# Patient Record
Sex: Female | Born: 1955 | Race: White | Hispanic: No | Smoking: Former smoker
Health system: Southern US, Community
[De-identification: ages and names within clinical notes are randomized; demographics above are authoritative.]

## PROBLEM LIST (undated history)

## (undated) DIAGNOSIS — Z8619 Personal history of other infectious and parasitic diseases: Secondary | ICD-10-CM

## (undated) DIAGNOSIS — F329 Major depressive disorder, single episode, unspecified: Secondary | ICD-10-CM

## (undated) DIAGNOSIS — T7840XA Allergy, unspecified, initial encounter: Secondary | ICD-10-CM

## (undated) DIAGNOSIS — F419 Anxiety disorder, unspecified: Secondary | ICD-10-CM

## (undated) DIAGNOSIS — F909 Attention-deficit hyperactivity disorder, unspecified type: Secondary | ICD-10-CM

## (undated) DIAGNOSIS — R42 Dizziness and giddiness: Secondary | ICD-10-CM

## (undated) DIAGNOSIS — M549 Dorsalgia, unspecified: Secondary | ICD-10-CM

## (undated) DIAGNOSIS — L309 Dermatitis, unspecified: Secondary | ICD-10-CM

## (undated) DIAGNOSIS — J45909 Unspecified asthma, uncomplicated: Secondary | ICD-10-CM

## (undated) DIAGNOSIS — R0600 Dyspnea, unspecified: Secondary | ICD-10-CM

## (undated) DIAGNOSIS — J449 Chronic obstructive pulmonary disease, unspecified: Secondary | ICD-10-CM

## (undated) DIAGNOSIS — M797 Fibromyalgia: Secondary | ICD-10-CM

## (undated) DIAGNOSIS — K219 Gastro-esophageal reflux disease without esophagitis: Secondary | ICD-10-CM

## (undated) DIAGNOSIS — F32A Depression, unspecified: Secondary | ICD-10-CM

## (undated) HISTORY — PX: LIVER BIOPSY: SHX301

## (undated) HISTORY — PX: TONSILLECTOMY: SUR1361

## (undated) HISTORY — DX: Depression, unspecified: F32.A

## (undated) HISTORY — PX: OVARIAN CYST REMOVAL: SHX89

## (undated) HISTORY — DX: Anxiety disorder, unspecified: F41.9

## (undated) HISTORY — DX: Dermatitis, unspecified: L30.9

## (undated) HISTORY — DX: Dizziness and giddiness: R42

## (undated) HISTORY — PX: BILATERAL CARPAL TUNNEL RELEASE: SHX6508

## (undated) HISTORY — PX: MOUTH SURGERY: SHX715

## (undated) HISTORY — PX: CYST REMOVAL HAND: SHX6279

## (undated) HISTORY — PX: TUBAL LIGATION: SHX77

## (undated) HISTORY — PX: ABDOMINAL HYSTERECTOMY: SHX81

## (undated) HISTORY — PX: KNEE SURGERY: SHX244

## (undated) HISTORY — DX: Fibromyalgia: M79.7

## (undated) HISTORY — DX: Attention-deficit hyperactivity disorder, unspecified type: F90.9

## (undated) HISTORY — DX: Allergy, unspecified, initial encounter: T78.40XA

## (undated) HISTORY — PX: ELBOW SURGERY: SHX618

## (undated) HISTORY — DX: Dorsalgia, unspecified: M54.9

## (undated) HISTORY — DX: Dyspnea, unspecified: R06.00

## (undated) HISTORY — DX: Chronic obstructive pulmonary disease, unspecified: J44.9

## (undated) HISTORY — DX: Personal history of other infectious and parasitic diseases: Z86.19

## (undated) HISTORY — PX: NOSE SURGERY: SHX723

## (undated) HISTORY — DX: Major depressive disorder, single episode, unspecified: F32.9

---

## 2015-12-10 DIAGNOSIS — F331 Major depressive disorder, recurrent, moderate: Secondary | ICD-10-CM | POA: Diagnosis not present

## 2015-12-21 DIAGNOSIS — F331 Major depressive disorder, recurrent, moderate: Secondary | ICD-10-CM | POA: Diagnosis not present

## 2015-12-28 DIAGNOSIS — N390 Urinary tract infection, site not specified: Secondary | ICD-10-CM | POA: Diagnosis not present

## 2015-12-28 DIAGNOSIS — M545 Low back pain: Secondary | ICD-10-CM | POA: Diagnosis not present

## 2015-12-30 DIAGNOSIS — K219 Gastro-esophageal reflux disease without esophagitis: Secondary | ICD-10-CM | POA: Diagnosis not present

## 2016-01-27 DIAGNOSIS — F331 Major depressive disorder, recurrent, moderate: Secondary | ICD-10-CM | POA: Diagnosis not present

## 2016-02-29 DIAGNOSIS — F331 Major depressive disorder, recurrent, moderate: Secondary | ICD-10-CM | POA: Diagnosis not present

## 2016-03-29 DIAGNOSIS — F331 Major depressive disorder, recurrent, moderate: Secondary | ICD-10-CM | POA: Diagnosis not present

## 2016-04-17 DIAGNOSIS — R200 Anesthesia of skin: Secondary | ICD-10-CM | POA: Diagnosis not present

## 2016-04-26 DIAGNOSIS — F331 Major depressive disorder, recurrent, moderate: Secondary | ICD-10-CM | POA: Diagnosis not present

## 2016-04-27 DIAGNOSIS — M779 Enthesopathy, unspecified: Secondary | ICD-10-CM | POA: Diagnosis not present

## 2016-05-10 DIAGNOSIS — F331 Major depressive disorder, recurrent, moderate: Secondary | ICD-10-CM | POA: Diagnosis not present

## 2016-05-23 DIAGNOSIS — F331 Major depressive disorder, recurrent, moderate: Secondary | ICD-10-CM | POA: Diagnosis not present

## 2016-05-29 DIAGNOSIS — M791 Myalgia: Secondary | ICD-10-CM | POA: Diagnosis not present

## 2016-05-29 DIAGNOSIS — M545 Low back pain: Secondary | ICD-10-CM | POA: Diagnosis not present

## 2016-06-22 DIAGNOSIS — F331 Major depressive disorder, recurrent, moderate: Secondary | ICD-10-CM | POA: Diagnosis not present

## 2016-06-28 DIAGNOSIS — H6123 Impacted cerumen, bilateral: Secondary | ICD-10-CM | POA: Diagnosis not present

## 2016-06-28 DIAGNOSIS — R42 Dizziness and giddiness: Secondary | ICD-10-CM | POA: Diagnosis not present

## 2016-07-19 DIAGNOSIS — F331 Major depressive disorder, recurrent, moderate: Secondary | ICD-10-CM | POA: Diagnosis not present

## 2016-08-01 DIAGNOSIS — L03114 Cellulitis of left upper limb: Secondary | ICD-10-CM | POA: Diagnosis not present

## 2016-08-01 DIAGNOSIS — L309 Dermatitis, unspecified: Secondary | ICD-10-CM | POA: Diagnosis not present

## 2016-08-01 DIAGNOSIS — M797 Fibromyalgia: Secondary | ICD-10-CM | POA: Diagnosis not present

## 2016-08-01 DIAGNOSIS — H6011 Cellulitis of right external ear: Secondary | ICD-10-CM | POA: Diagnosis not present

## 2016-08-01 DIAGNOSIS — R42 Dizziness and giddiness: Secondary | ICD-10-CM | POA: Diagnosis not present

## 2016-08-01 DIAGNOSIS — M545 Low back pain: Secondary | ICD-10-CM | POA: Diagnosis not present

## 2016-08-01 DIAGNOSIS — F418 Other specified anxiety disorders: Secondary | ICD-10-CM | POA: Diagnosis not present

## 2016-08-01 DIAGNOSIS — L03113 Cellulitis of right upper limb: Secondary | ICD-10-CM | POA: Diagnosis not present

## 2016-08-03 DIAGNOSIS — L237 Allergic contact dermatitis due to plants, except food: Secondary | ICD-10-CM | POA: Diagnosis not present

## 2016-08-03 DIAGNOSIS — H6011 Cellulitis of right external ear: Secondary | ICD-10-CM | POA: Diagnosis not present

## 2016-08-03 DIAGNOSIS — L03114 Cellulitis of left upper limb: Secondary | ICD-10-CM | POA: Diagnosis not present

## 2016-08-16 DIAGNOSIS — F331 Major depressive disorder, recurrent, moderate: Secondary | ICD-10-CM | POA: Diagnosis not present

## 2016-08-30 DIAGNOSIS — Z720 Tobacco use: Secondary | ICD-10-CM | POA: Diagnosis not present

## 2016-08-30 DIAGNOSIS — R002 Palpitations: Secondary | ICD-10-CM | POA: Diagnosis not present

## 2016-08-30 DIAGNOSIS — E785 Hyperlipidemia, unspecified: Secondary | ICD-10-CM | POA: Diagnosis not present

## 2016-09-07 DIAGNOSIS — Z23 Encounter for immunization: Secondary | ICD-10-CM | POA: Diagnosis not present

## 2016-09-13 DIAGNOSIS — F331 Major depressive disorder, recurrent, moderate: Secondary | ICD-10-CM | POA: Diagnosis not present

## 2016-09-18 DIAGNOSIS — F331 Major depressive disorder, recurrent, moderate: Secondary | ICD-10-CM | POA: Diagnosis not present

## 2017-08-30 DIAGNOSIS — Z23 Encounter for immunization: Secondary | ICD-10-CM | POA: Diagnosis not present

## 2017-12-15 ENCOUNTER — Ambulatory Visit: Payer: Medicare Other

## 2017-12-15 ENCOUNTER — Other Ambulatory Visit: Payer: Self-pay

## 2017-12-15 ENCOUNTER — Encounter: Payer: Self-pay | Admitting: Gynecology

## 2017-12-15 ENCOUNTER — Ambulatory Visit
Admission: EM | Admit: 2017-12-15 | Discharge: 2017-12-15 | Disposition: A | Discharge status: Home / Self Care | Payer: Medicare Other | Attending: Emergency Medicine | Admitting: Emergency Medicine

## 2017-12-15 DIAGNOSIS — J45909 Unspecified asthma, uncomplicated: Secondary | ICD-10-CM | POA: Diagnosis not present

## 2017-12-15 DIAGNOSIS — Z87891 Personal history of nicotine dependence: Secondary | ICD-10-CM | POA: Diagnosis not present

## 2017-12-15 DIAGNOSIS — K219 Gastro-esophageal reflux disease without esophagitis: Secondary | ICD-10-CM | POA: Diagnosis not present

## 2017-12-15 DIAGNOSIS — R05 Cough: Secondary | ICD-10-CM | POA: Diagnosis not present

## 2017-12-15 DIAGNOSIS — B1920 Unspecified viral hepatitis C without hepatic coma: Secondary | ICD-10-CM | POA: Diagnosis not present

## 2017-12-15 DIAGNOSIS — R059 Cough, unspecified: Secondary | ICD-10-CM

## 2017-12-15 HISTORY — DX: Unspecified asthma, uncomplicated: J45.909

## 2017-12-15 HISTORY — DX: Gastro-esophageal reflux disease without esophagitis: K21.9

## 2017-12-15 MED ORDER — DEXLANSOPRAZOLE 30 MG PO CPDR: 30.0000 mg | DELAYED_RELEASE_CAPSULE | Freq: Every day | ORAL | 0 refills | Status: DC

## 2017-12-15 NOTE — ED Provider Notes (Signed)
[facility]    CSN: [id number] Arrival date & time: 12/15/17  1120     History   Chief Complaint Chief Complaint  Patient presents with  . 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   HPI  62 year old female former smoker presents with a dry cough that she has had for 6 months.  It is worse at nighttime. Stopped Smoking 3 years ago but was vaping up to 1 year ago and finally stopped all inhalation.  Has had weight loss recently but she attributes that to a new puppy and the exercise that ensues.  Has a history of GERD and was on Dexilant but has not found a local primary care to re-prescribed medication.  She noticed that when she came off the Dexilant that the cough was somewhat worse.      Past Medical History:  Diagnosis Date  . Asthma   . GERD (gastroesophageal reflux disease)   . Hepatitis C     There are no active problems to display for this patient.   Past Surgical History:  Procedure Laterality Date  . ABDOMINAL HYSTERECTOMY    . BILATERAL CARPAL TUNNEL RELEASE    . CYST REMOVAL HAND    . ELBOW SURGERY    . KNEE SURGERY    . LIVER BIOPSY    . MOUTH SURGERY    . OVARIAN CYST REMOVAL    . TONSILLECTOMY    . TUBAL LIGATION      OB History    No data available       Home Medications    Prior to Admission medications   Medication Sig Start Date End Date Taking? Authorizing Provider  cetirizine (ZYRTEC) 10 MG tablet Take 10 mg by mouth daily.   Yes [provider]  Dexlansoprazole 30 MG capsule Take 1 capsule (30 mg total) by mouth daily. 12/15/17   Lutricia Feiloemer, Tison Leibold P, PA-C    Family History Family History  Problem Relation Age of Onset  . Heart failure Mother   . Emphysema Mother     Social History Social History   Tobacco Use  . Smoking status: Former Smoker    Last attempt to quit: 09/14/2014    Years since quitting: 3.2  . Smokeless tobacco: Never Used  Substance Use Topics  . Alcohol use: Not on file  .  Drug use: No     Allergies   Amitriptyline   Review of Systems Review of Systems  Constitutional: Negative for activity change, appetite change, chills, fatigue and fever.  Respiratory: Positive for cough. Negative for shortness of breath, wheezing and stridor.   All other systems reviewed and are negative.    Physical Exam Triage Vital Signs ED Triage Vitals  Enc Vitals Group     BP 12/15/17 1155 132/82     Pulse Rate 12/15/17 1155 87     Resp 12/15/17 1155 16     Temp 12/15/17 1155 98.7 F (37.1 C)     Temp Source 12/15/17 1155 Oral     SpO2 12/15/17 1155 98 %     Weight 12/15/17 1147 117 lb (53.1 kg)     Height 12/15/17 1147 5\' 1"  (1.549 m)     Head Circumference --      Peak Flow --      Pain Score 12/15/17 1147 2     Pain Loc --      Pain Edu? --      Excl. in GC? --  No data found.  Updated Vital Signs BP 132/82 (BP Location: Left Arm)   Pulse 87   Temp 98.7 F (37.1 C) (Oral)   Resp 16   Ht 5\' 1"  (1.549 m)   Wt 117 lb (53.1 kg)   SpO2 98%   BMI 22.11 kg/m   Visual Acuity Right Eye Distance:   Left Eye Distance:   Bilateral Distance:    Right Eye Near:   Left Eye Near:    Bilateral Near:     Physical Exam  Constitutional: She is oriented to person, place, and time. She appears well-developed and well-nourished. No distress.  HENT:  Head: Normocephalic.  Right Ear: External ear normal.  Left Ear: External ear normal.  Nose: Nose normal.  Mouth/Throat: Oropharynx is clear and moist. No oropharyngeal exudate.  Eyes: Pupils are equal, round, and reactive to light. Right eye exhibits no discharge. Left eye exhibits no discharge.  Neck: Normal range of motion.  Pulmonary/Chest: Effort normal and breath sounds normal.  Musculoskeletal: Normal range of motion.  Lymphadenopathy:    She has no cervical adenopathy.  Neurological: She is alert and oriented to person, place, and time.  Skin: Skin is warm and dry. She is not diaphoretic.    Psychiatric: She has a normal mood and affect. Her behavior is normal. Judgment and thought content normal.  Nursing note and vitals reviewed.    UC Treatments / Results  Labs (all labs ordered are listed, but only abnormal results are displayed) Labs Reviewed - No data to display  EKG  EKG Interpretation None       Radiology Dg Chest 2 View  Result Date: 12/15/2017 CLINICAL DATA:  Dry nonproductive cough. EXAM: CHEST  2 VIEW COMPARISON:  None. FINDINGS: The lungs are clear without focal pneumonia, edema, pneumothorax or pleural effusion. Tiny granuloma noted right upper lobe. The cardiopericardial silhouette is within normal limits for size. The visualized bony structures of the thorax are intact. IMPRESSION: No active cardiopulmonary disease. Electronically Signed   By: Kennith Center M.D.   On: 12/15/2017 13:01    Procedures Procedures (including critical care time)  Medications Ordered in UC Medications - No data to display   Initial Impression / Assessment and Plan / UC Course  I have reviewed the triage vital signs and the nursing notes.  Pertinent labs & imaging results that were available during my care of the patient were reviewed by me and considered in my medical decision making (see chart for details).     Plan: 1. Test/x-ray results and diagnosis reviewed with patient 2. rx as per orders; risks, benefits, potential side effects reviewed with patient 3. Recommend supportive treatment with use of Dexilant for GERD.  Is recommended that because of the chronic cough that she has had for 6 months, her past history of smoking as well as the granuloma found on x-ray today she should follow-up with a pulmonologist in the very near future.  So I given her the name of a primary care physician that she may contact to establish with. 4. F/u prn if symptoms worsen or don't improve   Final Clinical Impressions(s) / UC Diagnoses   Final diagnoses:  Cough   Gastroesophageal reflux disease, esophagitis presence not specified    ED Discharge Orders        Ordered    Dexlansoprazole 30 MG capsule  Daily     12/15/17 1354       Controlled Substance Prescriptions [place] Controlled Substance Registry consulted? Not  Applicable   Lutricia Feil, PA-C 12/15/17 1359

## 2017-12-15 NOTE — Discharge Instructions (Signed)
Recommend following up with pulmonologist because of the granuloma in the right upper lobe as well as the coughing that she had for over 6 months.

## 2017-12-15 NOTE — ED Triage Notes (Signed)
Patient c/o dry cough x 6 months/

## 2017-12-18 ENCOUNTER — Telehealth: Payer: Self-pay | Admitting: Emergency Medicine

## 2017-12-18 NOTE — Telephone Encounter (Signed)
Called to follow up after patient's recent visit. Patient states she is starting to feel better, but still coughing. Patient states she has appointment with Pulmonologist tomorrow.

## 2017-12-20 ENCOUNTER — Encounter: Payer: Self-pay | Admitting: Internal Medicine

## 2017-12-20 ENCOUNTER — Ambulatory Visit (INDEPENDENT_AMBULATORY_CARE_PROVIDER_SITE_OTHER): Payer: Medicare Other | Admitting: Internal Medicine

## 2017-12-20 DIAGNOSIS — R918 Other nonspecific abnormal finding of lung field: Secondary | ICD-10-CM

## 2017-12-20 MED ORDER — ALBUTEROL SULFATE HFA 108 (90 BASE) MCG/ACT IN AERS: 2.0000 | INHALATION_SPRAY | Freq: Four times a day (QID) | RESPIRATORY_TRACT | 2 refills | Status: DC | PRN

## 2017-12-20 NOTE — Patient Instructions (Signed)
CT chest needed PFT's ordered  Follow up after tests completed

## 2017-12-20 NOTE — Progress Notes (Signed)
Name: Sarah Mcdonald MRN: [id number] DOB: [birth year]     CONSULTATION DATE: 2.21.19 REFERRING MD :    CHIEF COMPLAINT:  abnormal CXR STUDIES:  CXR 11/2017 I have Independently reviewed images of  CXR   on 12/20/2017 Interpretation: I do NOT see any obvious abnormalities No masses or pneumonia     HISTORY OF PRESENT ILLNESS:   62 year old pleasant white female seen today for an abnormal report of a tiny granuloma in the right chest based on a chest x-ray that was performed [facility] Patient has been diagnosed with childhood asthma and as she grew older her asthma symptoms returned however she has been smoking 1 pack a day for the past 40 years She quit November 2015  Patient visited the urgent care approximately 2 weeks ago with acute cough chest congestion and right shoulder pain Patient was diagnosed with really bad esophageal reflux and was given proton pump inhibitor Her chest x-ray showed an abnormal finding of a tiny granuloma in the right upper lobe  At this time patient does not have any acute respiratory symptoms at this time Patient patient has occasional shortness of breath and dyspnea on exertion Patient does not have any acute cough or wheezing at this time Patient uses albuterol as needed very infrequently and thus the only inhaler she has at this time  Patient has no signs of infection at this time No signs of acute exacerbation at this time No signs of acute CHF at this time  PAST MEDICAL HISTORY :   has a past medical history of Asthma, GERD (gastroesophageal reflux disease), and Hepatitis C.  has a past surgical history that includes Liver biopsy; Bilateral carpal tunnel release; Cyst removal hand; Elbow surgery; Knee surgery; Mouth surgery; Tonsillectomy; Abdominal hysterectomy; Tubal ligation; and Ovarian cyst removal. Prior to Admission medications   Medication Sig Start Date End Date Taking? Authorizing Provider  cetirizine (ZYRTEC) 10 MG  tablet Take 10 mg by mouth daily.    [provider]  Dexlansoprazole 30 MG capsule Take 1 capsule (30 mg total) by mouth daily. 12/15/17   Lutricia Feil, PA-C   Allergies  Allergen Reactions  . Amitriptyline     FAMILY HISTORY:  family history includes Emphysema in her mother; Heart failure in her mother. SOCIAL HISTORY:  reports that she quit smoking about 3 years ago. she has never used smokeless tobacco. She reports that she does not use drugs.  REVIEW OF SYSTEMS:   Constitutional: Negative for fever, chills, weight loss, malaise/fatigue and diaphoresis.  HENT: Negative for hearing loss, ear pain, nosebleeds, congestion, sore throat, neck pain, tinnitus and ear discharge.   Eyes: Negative for blurred vision, double vision, photophobia, pain, discharge and redness.  Respiratory: Negative for cough, hemoptysis, sputum production, +shortness of breath, + wheezing and stridor.   Cardiovascular: Negative for chest pain, palpitations, orthopnea, claudication, leg swelling and PND.  Gastrointestinal: Negative for heartburn, nausea, vomiting, abdominal pain, diarrhea, constipation, blood in stool and melena.  Genitourinary: Negative for dysuria, urgency, frequency, hematuria and flank pain.  Musculoskeletal: Negative for myalgias, back pain, joint pain and falls.  Skin: Negative for itching and rash.  Neurological: Negative for dizziness, tingling, tremors, sensory change, speech change, focal weakness, seizures, loss of consciousness, weakness and headaches.  Endo/Heme/Allergies: Negative for environmental allergies and polydipsia. Does not bruise/bleed easily.  ALL OTHER ROS ARE NEGATIVE  BP 128/80 (BP Location: Left Arm, Cuff Size: Normal)   Pulse 77   Resp 16  Ht 5\' 1"  (1.549 m)   Wt 125 lb (56.7 kg)   SpO2 97%   BMI 23.62 kg/m   Physical Examination:   GENERAL:NAD, no fevers, chills, no weakness no fatigue HEAD: Normocephalic, atraumatic.  EYES: Pupils equal,  round, reactive to light. Extraocular muscles intact. No scleral icterus.  MOUTH: Moist mucosal membrane.   EAR, NOSE, THROAT: Clear without exudates. No external lesions.  NECK: Supple. No thyromegaly. No nodules. No JVD.  PULMONARY:CTA B/L no wheezes, no crackles, no rhonchi CARDIOVASCULAR: S1 and S2. Regular rate and rhythm. No murmurs, rubs, or gallops. No edema.  GASTROINTESTINAL: Soft, nontender, nondistended. No masses. Positive bowel sounds.  MUSCULOSKELETAL: No swelling, clubbing, or edema. Range of motion full in all extremities.  NEUROLOGIC: Cranial nerves II through XII are intact. No gross focal neurological deficits.  SKIN: No ulceration, lesions, rashes, or cyanosis. Skin warm and dry. Turgor intact.  PSYCHIATRIC: Mood, affect within normal limits. The patient is awake, alert and oriented x 3. Insight, judgment intact.      ASSESSMENT / PLAN: 62 year old pleasant white female with a past medical history of asthma as a child with extensive smoking history of 1 pack a day for the past 40 years in the setting of intermittent shortness of breath and dyspnea on exertion with intermittent wheezing most likely is related to underlying COPD with an abnormal chest x-ray that shows a tiny granuloma in the right upper lobe however a CT chest is needed for further evaluation  At this time her presumed COPD seems to be stable and controlled with albuterol alone Patient will need pulmonary function testing to assess her lung function Patient will need CT of his chest to fully evaluate her right upper lobe granuloma  Patient satisfied with Plan of action and management. All questions answered Patient to follow up after tests completed  Lucie LeatherKurian David Tyrek Lawhorn, M.D.  [facility]  Medical Director [facility] Medical Director [facility]

## 2018-01-01 ENCOUNTER — Ambulatory Visit: Payer: Medicare Other | Attending: Internal Medicine

## 2018-01-01 ENCOUNTER — Ambulatory Visit
Admission: RE | Admit: 2018-01-01 | Discharge: 2018-01-01 | Disposition: A | Discharge status: Home / Self Care | Payer: Medicare Other | Source: Ambulatory Visit | Attending: Internal Medicine | Admitting: Internal Medicine

## 2018-01-01 DIAGNOSIS — R911 Solitary pulmonary nodule: Secondary | ICD-10-CM | POA: Diagnosis not present

## 2018-01-01 DIAGNOSIS — R918 Other nonspecific abnormal finding of lung field: Secondary | ICD-10-CM

## 2018-01-01 MED ORDER — ALBUTEROL SULFATE (2.5 MG/3ML) 0.083% IN NEBU
2.5000 mg | INHALATION_SOLUTION | Freq: Once | RESPIRATORY_TRACT | Status: AC
  Administered 2018-01-01: 2.5 mg via RESPIRATORY_TRACT
  Filled 2018-01-01: qty 3

## 2018-01-08 ENCOUNTER — Ambulatory Visit (INDEPENDENT_AMBULATORY_CARE_PROVIDER_SITE_OTHER): Payer: Medicare Other | Admitting: Family Medicine

## 2018-01-08 ENCOUNTER — Encounter: Payer: Self-pay | Admitting: Family Medicine

## 2018-01-08 ENCOUNTER — Telehealth: Payer: Self-pay

## 2018-01-08 VITALS — BP 120/70 | HR 64 | Ht 61.0 in | Wt 126.0 lb

## 2018-01-08 DIAGNOSIS — K219 Gastro-esophageal reflux disease without esophagitis: Secondary | ICD-10-CM

## 2018-01-08 DIAGNOSIS — M545 Low back pain, unspecified: Secondary | ICD-10-CM

## 2018-01-08 DIAGNOSIS — Z7689 Persons encountering health services in other specified circumstances: Secondary | ICD-10-CM

## 2018-01-08 DIAGNOSIS — F329 Major depressive disorder, single episode, unspecified: Secondary | ICD-10-CM

## 2018-01-08 DIAGNOSIS — G8929 Other chronic pain: Secondary | ICD-10-CM | POA: Diagnosis not present

## 2018-01-08 DIAGNOSIS — J449 Chronic obstructive pulmonary disease, unspecified: Secondary | ICD-10-CM

## 2018-01-08 DIAGNOSIS — S20419A Abrasion of unspecified back wall of thorax, initial encounter: Secondary | ICD-10-CM | POA: Insufficient documentation

## 2018-01-08 DIAGNOSIS — F32A Depression, unspecified: Secondary | ICD-10-CM

## 2018-01-08 MED ORDER — DEXLANSOPRAZOLE 60 MG PO CPDR: 60.0000 mg | DELAYED_RELEASE_CAPSULE | Freq: Every day | ORAL | 1 refills | Status: DC

## 2018-01-08 MED ORDER — ALBUTEROL SULFATE HFA 108 (90 BASE) MCG/ACT IN AERS: 2.0000 | INHALATION_SPRAY | Freq: Four times a day (QID) | RESPIRATORY_TRACT | 2 refills | Status: DC | PRN

## 2018-01-08 MED ORDER — CYCLOBENZAPRINE HCL 10 MG PO TABS: 10.0000 mg | ORAL_TABLET | Freq: Three times a day (TID) | ORAL | 1 refills | Status: DC | PRN

## 2018-01-08 MED ORDER — CETIRIZINE HCL 10 MG PO TABS: 10.0000 mg | ORAL_TABLET | Freq: Every day | ORAL | 1 refills | Status: DC

## 2018-01-08 NOTE — Progress Notes (Addendum)
Name: Sarah DeutscherLoren Gurka   MRN: [id number]    DOB: 07/10/1956   Date:01/08/2018       Progress Note  Subjective  Chief Complaint  Chief Complaint  Patient presents with  . Establish Care    [place]oved from [place]  . Gastroesophageal Reflux    need to go back to GI doctor d/t Dexilant not working anymore. has "dry cough from GERD"    Patient present for establishment of care.   Gastroesophageal Reflux  She complains of abdominal pain, belching, chest pain, choking, coughing, dysphagia, heartburn and nausea. She reports no early satiety, no globus sensation, no hoarse voice, no sore throat, no stridor, no tooth decay, no water brash or no wheezing. This is a new problem. The current episode started more than 1 year ago. The problem has been waxing and waning. The heartburn duration is more than one hour. The heartburn is located in the substernum. The heartburn is of moderate intensity. The heartburn wakes her from sleep. The heartburn changes with position. The symptoms are aggravated by certain foods, lying down and bending. Pertinent negatives include no anemia, fatigue, melena or weight loss. Risk factors include smoking/tobacco exposure. She has tried a PPI (dexilant) for the symptoms. The treatment provided moderate relief.  Depression         This is a recurrent problem.  The current episode started more than 1 month ago.   The problem has been waxing and waning since onset.  Associated symptoms include decreased concentration, irritable, decreased interest and sad.  Associated symptoms include no fatigue, no helplessness, no hopelessness, does not have insomnia, no myalgias, no headaches and no suicidal ideas.   No problem-specific Assessment & Plan notes found for this encounter.   Past Medical History:  Diagnosis Date  . Asthma   . Fibromyalgia   . GERD (gastroesophageal reflux disease)   . Hepatitis C     Past Surgical History:  Procedure Laterality Date  . ABDOMINAL HYSTERECTOMY     . BILATERAL CARPAL TUNNEL RELEASE    . CYST REMOVAL HAND    . ELBOW SURGERY    . KNEE SURGERY    . LIVER BIOPSY    . MOUTH SURGERY    . OVARIAN CYST REMOVAL    . TONSILLECTOMY    . TUBAL LIGATION      Family History  Problem Relation Age of Onset  . Heart failure Mother   . Emphysema Mother   . Cancer Sister   . Cancer Sister   . COPD Brother   . Hepatitis C Brother     Social History   Socioeconomic History  . Marital status: Divorced    Spouse name: Not on file  . Number of children: Not on file  . Years of education: Not on file  . Highest education level: Not on file  Social Needs  . Financial resource strain: Not on file  . Food insecurity - worry: Not on file  . Food insecurity - inability: Not on file  . Transportation needs - medical: Not on file  . Transportation needs - non-medical: Not on file  Occupational History  . Not on file  Tobacco Use  . Smoking status: Former Smoker    Packs/day: 1.00    Years: 41.00    Pack years: 41.00    Types: Cigarettes    Last attempt to quit: 09/14/2014    Years since quitting: 3.3  . Smokeless tobacco: Never Used  Substance and Sexual Activity  .  Alcohol use: Yes  . Drug use: No  . Sexual activity: Yes  Other Topics Concern  . Not on file  Social History Narrative  . Not on file    Allergies  Allergen Reactions  . Seroquel [Quetiapine Fumarate] Other (See Comments)    Altered mental status  . Amitriptyline     Outpatient Medications Prior to Visit  Medication Sig Dispense Refill  . albuterol (PROVENTIL HFA;VENTOLIN HFA) 108 (90 Base) MCG/ACT inhaler Inhale 2 puffs into the lungs every 6 (six) hours as needed for wheezing or shortness of breath. 1 Inhaler 2  . cetirizine (ZYRTEC) 10 MG tablet Take 10 mg by mouth daily.    Marland Kitchen Dexlansoprazole 30 MG capsule Take 1 capsule (30 mg total) by mouth daily. 30 capsule 0   No facility-administered medications prior to visit.     Review of Systems   Constitutional: Negative for chills, fatigue, fever, malaise/fatigue and weight loss.  HENT: Negative for ear discharge, ear pain, hoarse voice and sore throat.   Eyes: Negative for blurred vision.  Respiratory: Positive for cough and choking. Negative for sputum production, shortness of breath and wheezing.   Cardiovascular: Positive for chest pain. Negative for palpitations and leg swelling.  Gastrointestinal: Positive for abdominal pain, dysphagia, heartburn and nausea. Negative for blood in stool, constipation, diarrhea and melena.  Genitourinary: Negative for dysuria, frequency, hematuria and urgency.  Musculoskeletal: Negative for back pain, joint pain, myalgias and neck pain.  Skin: Negative for rash.  Neurological: Negative for dizziness, tingling, sensory change, focal weakness and headaches.  Endo/Heme/Allergies: Negative for environmental allergies and polydipsia. Does not bruise/bleed easily.  Psychiatric/Behavioral: Positive for decreased concentration. Negative for depression and suicidal ideas. The patient is not nervous/anxious and does not have insomnia.      Objective  Vitals:   01/08/18 1432  BP: 120/70  Pulse: 64  Weight: 126 lb (57.2 kg)  Height: 5\' 1"  (1.549 m)    Physical Exam  Constitutional: She is well-developed, well-nourished, and in no distress. She is irritable. No distress.  HENT:  Head: Normocephalic and atraumatic.  Right Ear: External ear normal.  Left Ear: External ear normal.  Nose: Nose normal.  Mouth/Throat: Oropharynx is clear and moist.  Eyes: Conjunctivae and EOM are normal. Pupils are equal, round, and reactive to light. Right eye exhibits no discharge. Left eye exhibits no discharge.  Neck: Normal range of motion. Neck supple. No JVD present. No thyromegaly present.  Cardiovascular: Normal rate, regular rhythm, normal heart sounds and intact distal pulses. Exam reveals no gallop and no friction rub.  No murmur heard. Pulmonary/Chest:  Effort normal and breath sounds normal. She has no wheezes. She has no rales.  Abdominal: Soft. Bowel sounds are normal. She exhibits no mass. There is no tenderness. There is no guarding.  Musculoskeletal: Normal range of motion. She exhibits no edema.  Lymphadenopathy:    She has no cervical adenopathy.  Neurological: She is alert. She has normal reflexes.  Skin: Skin is warm and dry. She is not diaphoretic.  Psychiatric: Mood and affect normal.  Nursing note and vitals reviewed.     Assessment & Plan  Problem List Items Addressed This Visit    None    Visit Diagnoses    Establishing care with new doctor, encounter for    -  Primary   Chronic low back pain without sciatica, unspecified back pain laterality       no problem with cyclobenzaprine   Relevant Medications   cyclobenzaprine (  FLEXERIL) 10 MG tablet   Gastroesophageal reflux disease, esophagitis presence not specified       Relevant Medications   dexlansoprazole (DEXILANT) 60 MG capsule   Other Relevant Orders   Ambulatory referral to Gastroenterology   Chronic obstructive pulmonary disease, unspecified COPD type (HCC)       Relevant Medications   cetirizine (ZYRTEC) 10 MG tablet   albuterol (PROVENTIL HFA;VENTOLIN HFA) 108 (90 Base) MCG/ACT inhaler      Meds ordered this encounter  Medications  . cyclobenzaprine (FLEXERIL) 10 MG tablet    Sig: Take 1 tablet (10 mg total) by mouth 3 (three) times daily as needed for muscle spasms.    Dispense:  90 tablet    Refill:  1  . dexlansoprazole (DEXILANT) 60 MG capsule    Sig: Take 1 capsule (60 mg total) by mouth daily.    Dispense:  90 capsule    Refill:  1  . cetirizine (ZYRTEC) 10 MG tablet    Sig: Take 1 tablet (10 mg total) by mouth daily.    Dispense:  90 tablet    Refill:  1  . albuterol (PROVENTIL HFA;VENTOLIN HFA) 108 (90 Base) MCG/ACT inhaler    Sig: Inhale 2 puffs into the lungs every 6 (six) hours as needed for wheezing or shortness of breath.     Dispense:  1 Inhaler    Refill:  2      Dr. Hayden [facility]  01/08/18

## 2018-01-08 NOTE — Telephone Encounter (Signed)
Called pt to sched AWV w/ [facility]. Scheduled to be seen 01/09/18. 

## 2018-01-09 ENCOUNTER — Ambulatory Visit (INDEPENDENT_AMBULATORY_CARE_PROVIDER_SITE_OTHER): Payer: Medicare Other

## 2018-01-09 VITALS — BP 122/60 | HR 62 | Temp 98.2°F | Resp 12 | Ht 61.0 in | Wt 126.4 lb

## 2018-01-09 DIAGNOSIS — Z0000 Encounter for general adult medical examination without abnormal findings: Secondary | ICD-10-CM | POA: Diagnosis not present

## 2018-01-09 DIAGNOSIS — Z1231 Encounter for screening mammogram for malignant neoplasm of breast: Secondary | ICD-10-CM | POA: Diagnosis not present

## 2018-01-09 DIAGNOSIS — Z1239 Encounter for other screening for malignant neoplasm of breast: Secondary | ICD-10-CM

## 2018-01-09 MED ORDER — SERTRALINE HCL 25 MG PO TABS: 25.0000 mg | ORAL_TABLET | Freq: Every day | ORAL | 1 refills | Status: DC

## 2018-01-09 NOTE — Patient Instructions (Signed)
Sarah Mcdonald , Thank you for taking time to come for your Medicare Wellness Visit. I appreciate your ongoing commitment to your health goals. Please review the following plan we discussed and let me know if I can assist you in the future.   Screening recommendations/referrals: Colorectal Screening: Completed colonoscopy 10/31/11. Repeat every 10 years Mammogram: Ordered today Bone Density: Not yet required  Vision/Dental Exams: Recommended yearly ophthalmology/optometry visit for glaucoma screening and checkup Recommended yearly dental visit for hygiene and checkup  Vaccinations: Influenza vaccine: Up to date Pneumococcal vaccine: Not yet required Tdap vaccine: Up to date Shingles vaccine: Not yet required    Advanced directives: Advance directive discussed with you today. I have provided a copy for you to complete at home and have notarized. Once this is complete please bring a copy in to our office so we can scan it into your chart.  Conditions/risks identified: Recommend to drink at least 6-8 8oz glasses of water per day.  Next appointment: Please schedule your Annual Wellness Visit with your Nurse Health Advisor in one year.  Preventive Care 40-64 Years, Female Preventive care refers to lifestyle choices and visits with your health care provider that can promote health and wellness. What does preventive care include?  A yearly physical exam. This is also called an annual well check.  Dental exams once or twice a year.  Routine eye exams. Ask your health care provider how often you should have your eyes checked.  Personal lifestyle choices, including:  Daily care of your teeth and gums.  Regular physical activity.  Eating a healthy diet.  Avoiding tobacco and drug use.  Limiting alcohol use.  Practicing safe sex.  Taking low-dose aspirin daily starting at age 54.  Taking vitamin and mineral supplements as recommended by your health care provider. What happens during  an annual well check? The services and screenings done by your health care provider during your annual well check will depend on your age, overall health, lifestyle risk factors, and family history of disease. Counseling  Your health care provider may ask you questions about your:  Alcohol use.  Tobacco use.  Drug use.  Emotional well-being.  Home and relationship well-being.  Sexual activity.  Eating habits.  Work and work Statistician.  Method of birth control.  Menstrual cycle.  Pregnancy history. Screening  You may have the following tests or measurements:  Height, weight, and BMI.  Blood pressure.  Lipid and cholesterol levels. These may be checked every 5 years, or more frequently if you are over 82 years old.  Skin check.  Lung cancer screening. You may have this screening every year starting at age 11 if you have a 30-pack-year history of smoking and currently smoke or have quit within the past 15 years.  Fecal occult blood test (FOBT) of the stool. You may have this test every year starting at age 91.  Flexible sigmoidoscopy or colonoscopy. You may have a sigmoidoscopy every 5 years or a colonoscopy every 10 years starting at age 31.  Hepatitis C blood test.  Hepatitis B blood test.  Sexually transmitted disease (STD) testing.  Diabetes screening. This is done by checking your blood sugar (glucose) after you have not eaten for a while (fasting). You may have this done every 1-3 years.  Mammogram. This may be done every 1-2 years. Talk to your health care provider about when you should start having regular mammograms. This may depend on whether you have a family history of breast cancer.  BRCA-related cancer screening. This may be done if you have a family history of breast, ovarian, tubal, or peritoneal cancers.  Pelvic exam and Pap test. This may be done every 3 years starting at age 59. Starting at age 3, this may be done every 5 years if you have a  Pap test in combination with an HPV test.  Bone density scan. This is done to screen for osteoporosis. You may have this scan if you are at high risk for osteoporosis. Discuss your test results, treatment options, and if necessary, the need for more tests with your health care provider. Vaccines  Your health care provider may recommend certain vaccines, such as:  Influenza vaccine. This is recommended every year.  Tetanus, diphtheria, and acellular pertussis (Tdap, Td) vaccine. You may need a Td booster every 10 years.  Zoster vaccine. You may need this after age 9.  Pneumococcal 13-valent conjugate (PCV13) vaccine. You may need this if you have certain conditions and were not previously vaccinated.  Pneumococcal polysaccharide (PPSV23) vaccine. You may need one or two doses if you smoke cigarettes or if you have certain conditions. Talk to your health care provider about which screenings and vaccines you need and how often you need them. This information is not intended to replace advice given to you by your health care provider. Make sure you discuss any questions you have with your health care provider. Document Released: 11/12/2015 Document Revised: 07/05/2016 Document Reviewed: 08/17/2015 Elsevier Interactive Patient Education  2017 [place] Prevention in the Home Falls can cause injuries. They can happen to people of all ages. There are many things you can do to make your home safe and to help prevent falls. What can I do on the outside of my home?  Regularly fix the edges of walkways and driveways and fix any cracks.  Remove anything that might make you trip as you walk through a door, such as a raised step or threshold.  Trim any bushes or trees on the path to your home.  Use bright outdoor lighting.  Clear any walking paths of anything that might make someone trip, such as rocks or tools.  Regularly check to see if handrails are loose or broken. Make sure  that both sides of any steps have handrails.  Any raised decks and porches should have guardrails on the edges.  Have any leaves, snow, or ice cleared regularly.  Use sand or salt on walking paths during winter.  Clean up any spills in your garage right away. This includes oil or grease spills. What can I do in the bathroom?  Use night lights.  Install grab bars by the toilet and in the tub and shower. Do not use towel bars as grab bars.  Use non-skid mats or decals in the tub or shower.  If you need to sit down in the shower, use a plastic, non-slip stool.  Keep the floor dry. Clean up any water that spills on the floor as soon as it happens.  Remove soap buildup in the tub or shower regularly.  Attach bath mats securely with double-sided non-slip rug tape.  Do not have throw rugs and other things on the floor that can make you trip. What can I do in the bedroom?  Use night lights.  Make sure that you have a light by your bed that is easy to reach.  Do not use any sheets or blankets that are too big for your bed. They  should not hang down onto the floor.  Have a firm chair that has side arms. You can use this for support while you get dressed.  Do not have throw rugs and other things on the floor that can make you trip. What can I do in the kitchen?  Clean up any spills right away.  Avoid walking on wet floors.  Keep items that you use a lot in easy-to-reach places.  If you need to reach something above you, use a strong step stool that has a grab bar.  Keep electrical cords out of the way.  Do not use floor polish or wax that makes floors slippery. If you must use wax, use non-skid floor wax.  Do not have throw rugs and other things on the floor that can make you trip. What can I do with my stairs?  Do not leave any items on the stairs.  Make sure that there are handrails on both sides of the stairs and use them. Fix handrails that are broken or loose. Make  sure that handrails are as long as the stairways.  Check any carpeting to make sure that it is firmly attached to the stairs. Fix any carpet that is loose or worn.  Avoid having throw rugs at the top or bottom of the stairs. If you do have throw rugs, attach them to the floor with carpet tape.  Make sure that you have a light switch at the top of the stairs and the bottom of the stairs. If you do not have them, ask someone to add them for you. What else can I do to help prevent falls?  Wear shoes that:  Do not have high heels.  Have rubber bottoms.  Are comfortable and fit you well.  Are closed at the toe. Do not wear sandals.  If you use a stepladder:  Make sure that it is fully opened. Do not climb a closed stepladder.  Make sure that both sides of the stepladder are locked into place.  Ask someone to hold it for you, if possible.  Clearly mark and make sure that you can see:  Any grab bars or handrails.  First and last steps.  Where the edge of each step is.  Use tools that help you move around (mobility aids) if they are needed. These include:  Canes.  Walkers.  Scooters.  Crutches.  Turn on the lights when you go into a dark area. Replace any light bulbs as soon as they burn out.  Set up your furniture so you have a clear path. Avoid moving your furniture around.  If any of your floors are uneven, fix them.  If there are any pets around you, be aware of where they are.  Review your medicines with your doctor. Some medicines can make you feel dizzy. This can increase your chance of falling. Ask your doctor what other things that you can do to help prevent falls. This information is not intended to replace advice given to you by your health care provider. Make sure you discuss any questions you have with your health care provider. Document Released: 08/12/2009 Document Revised: 03/23/2016 Document Reviewed: 11/20/2014 Elsevier Interactive Patient Education   2017 [employer].

## 2018-01-09 NOTE — Progress Notes (Signed)
Subjective:   Sarah Mcdonald is a 62 y.o. female who presents for Medicare Annual (Subsequent) preventive examination.  Review of Systems:  N/A Cardiac Risk Factors include: advanced age (>2955men, 45>65 women);family history of premature cardiovascular disease;smoking/ tobacco exposure     Objective:     Vitals: BP 122/60 (BP Location: Right Arm, Patient Position: Sitting, Cuff Size: Normal)   Pulse 62   Temp 98.2 F (36.8 C) (Oral)   Resp 12   Ht 5\' 1"  (1.549 m)   Wt 126 lb 6.4 oz (57.3 kg)   SpO2 97%   BMI 23.88 kg/m   Body mass index is 23.88 kg/m.  Advanced Directives 01/09/2018 12/15/2017  Does Patient Have a Medical Advance Directive? No No  Would patient like information on creating a medical advance directive? Yes (MAU/Ambulatory/Procedural Areas - Information given) -    Tobacco Social History   Tobacco Use  Smoking Status Former Smoker  . Packs/day: 1.00  . Years: 41.00  . Pack years: 41.00  . Types: Cigarettes  . Last attempt to quit: 09/14/2014  . Years since quitting: 3.3  Smokeless Tobacco Never Used  Tobacco Comment   smoking cessation materials not required     Counseling given: No Comment: smoking cessation materials not required   Clinical Intake:  Pre-visit preparation completed: Yes  Pain : No/denies pain   BMI - recorded: 23.81 Nutritional Status: BMI of 19-24  Normal Nutritional Risks: None Diabetes: No  How often do you need to have someone help you when you read instructions, pamphlets, or other written materials from your doctor or pharmacy?: 1 - Never  Interpreter Needed?: No  Information entered by :: AEversole, LPN  Past Medical History:  Diagnosis Date  . ADHD   . Asthma   . Dyspnea   . Fibromyalgia   . GERD (gastroesophageal reflux disease)   . Vertigo    Past Surgical History:  Procedure Laterality Date  . ABDOMINAL HYSTERECTOMY    . BILATERAL CARPAL TUNNEL RELEASE    . CYST REMOVAL HAND    . ELBOW SURGERY      . KNEE SURGERY    . LIVER BIOPSY    . MOUTH SURGERY    . NOSE SURGERY     deviated septal repair  . OVARIAN CYST REMOVAL    . TONSILLECTOMY    . TUBAL LIGATION     Family History  Problem Relation Age of Onset  . Heart failure Mother   . Emphysema Mother   . Healthy Father   . Cancer Sister   . Cancer Brother   . Cancer Sister        throat  . COPD Brother   . Hepatitis C Brother   . Pneumonia Sister    Social History   Socioeconomic History  . Marital status: Divorced    Spouse name: None  . Number of children: 3  . Years of education: some college  . Highest education level: 12th grade  Social Needs  . Financial resource strain: Not hard at all  . Food insecurity - worry: Never true  . Food insecurity - inability: Never true  . Transportation needs - medical: No  . Transportation needs - non-medical: No  Occupational History  . Occupation: Disabled  Tobacco Use  . Smoking status: Former Smoker    Packs/day: 1.00    Years: 41.00    Pack years: 41.00    Types: Cigarettes    Last attempt to quit: 09/14/2014  Years since quitting: 3.3  . Smokeless tobacco: Never Used  . Tobacco comment: smoking cessation materials not required  Substance and Sexual Activity  . Alcohol use: Yes    Alcohol/week: 7.2 oz    Types: 12 Cans of beer per week  . Drug use: No  . Sexual activity: Yes  Other Topics Concern  . None  Social History Narrative  . None    Outpatient Encounter Medications as of 01/09/2018  Medication Sig  . albuterol (PROVENTIL HFA;VENTOLIN HFA) 108 (90 Base) MCG/ACT inhaler Inhale 2 puffs into the lungs every 6 (six) hours as needed for wheezing or shortness of breath.  . cetirizine (ZYRTEC) 10 MG tablet Take 1 tablet (10 mg total) by mouth daily.  . cyclobenzaprine (FLEXERIL) 10 MG tablet Take 1 tablet (10 mg total) by mouth 3 (three) times daily as needed for muscle spasms.  Marland Kitchen dexlansoprazole (DEXILANT) 60 MG capsule Take 1 capsule (60 mg total) by  mouth daily.   No facility-administered encounter medications on file as of 01/09/2018.     Activities of Daily Living In your present state of health, do you have any difficulty performing the following activities: 01/09/2018  Hearing? N  Comment denies hearing aids  Vision? N  Comment wears eyeglasses  Difficulty concentrating or making decisions? Y  Comment long and short term memory loss  Walking or climbing stairs? Y  Comment dyspnea, joint pain  Dressing or bathing? N  Doing errands, shopping? N  Preparing Food and eating ? N  Comment full set upper and lower dentures  Using the Toilet? N  In the past six months, have you accidently leaked urine? Y  Comment stress incontinence  Do you have problems with loss of bowel control? Y  Managing your Medications? N  Managing your Finances? N  Housekeeping or managing your Housekeeping? N    Patient Care Team: Duanne Limerick, MD as PCP - General (Family Medicine) Midge Minium, MD as Consulting Physician (Gastroenterology) Erin Fulling, MD as Consulting Physician (Pulmonary Disease)    Assessment:   This is a routine wellness examination for [name].  Exercise Activities and Dietary recommendations Current Exercise Habits: The patient does not participate in regular exercise at present, Exercise limited by: None identified  Goals    . DIET - INCREASE WATER INTAKE     Recommend to drink at least 6-8 8oz glasses of water per day.       Fall Risk Fall Risk  01/09/2018  Falls in the past year? No  Risk for fall due to : History of fall(s);Impaired balance/gait;Impaired vision  Risk for fall due to: Comment vertigo; wears eyeglasses   Is the patient's home free of loose throw rugs in walkways, pet beds, electrical cords, etc?   Yes Does the patient have any grab bars in the bathroom? No  Does the patient use a shower chair when bathing? No Does the patient have any stairs in or around the home? Yes If so, are there any  handrails?  No Does the patient have adequate lighting?  Yes Does the patient use a cane, walker or w/c? No Does the patient use of an elevated toilet seat? No  Timed Get Up and Go Performed: Yes. Pt ambulated 10 feet within 4 sec. Gait stead-fast and without the use of an assistive device. No intervention required at this time. Fall risk prevention has been discussed.  Pt declined my offer to send [employer] Referral to Care Guide for  installation of  grab bars in the shower, shower chair or an elevated toilet seat.  Depression Screen PHQ 2/9 Scores 01/09/2018 01/08/2018  PHQ - 2 Score 2 3  PHQ- 9 Score 16 12     Cognitive Function     6CIT Screen 01/09/2018  What Year? 0 points  What month? 0 points  What time? 0 points  Count back from 20 0 points  Months in reverse 0 points  Repeat phrase 0 points  Total Score 0    Immunization History  Administered Date(s) Administered  . Influenza-Unspecified 07/30/2017  . Tdap 03/30/2010    Qualifies for Shingles Vaccine? No  Screening Tests Health Maintenance  Topic Date Due  . PAP SMEAR  10/30/2010  . MAMMOGRAM  10/30/2016  . TETANUS/TDAP  03/30/2020  . COLONOSCOPY  10/30/2021  . INFLUENZA VACCINE  Completed  . Hepatitis C Screening  Completed  . HIV Screening  Completed    Cancer Screenings: Lung: Low Dose CT Chest recommended if Age 66-80 years, 30 pack-year currently smoking OR have quit w/in 15years. Patient does qualify. An Epic message has been sent to Glenna Fellows, RN (Oncology Nurse Navigator) regarding the possible need for this exam. Ines Bloomer will review the patient's chart to determine if the patient truly qualifies for the exam. If the patient qualifies, Ines Bloomer will order the Low Dose CT of the chest to facilitate the scheduling of this exam. Breast:  Up to date on Mammogram? No. Completed 10/31/15/ Repeat every year. Ordered today. Message sent to referral coordinator for scheduling purposes.   Up to date of  Bone Density/Dexa? No. Does not yet qualify for this screening. Colorectal: Completed colonoscopy 10/31/11. Repeat every 10 years  Additional Screenings: Hepatitis B/HIV/Syphillis: Completed 03/30/93 Hepatitis C Screening:  Completed 12/15/17     Plan:  I have personally reviewed and addressed the Medicare Annual Wellness questionnaire and have noted the following in the patient's chart:  A. Medical and social history B. Use of alcohol, tobacco or illicit drugs  C. Current medications and supplements D. Functional ability and status E.  Nutritional status F.  Physical activity G. Advance directives H. List of other physicians I.  Hospitalizations, surgeries, and ER visits in previous 12 months J.  Vitals K. Screenings such as hearing and vision if needed, cognitive and depression L. Referrals and appointments - none  In addition, I have reviewed and discussed with patient certain preventive protocols, quality metrics, and best practice recommendations. A written personalized care plan for preventive services as well as general preventive health recommendations were provided to patient.  Signed,  Deon Pilling, LPN Nurse Health Advisor  MD Recommendations: Patient does qualify. An Epic message has been sent to Glenna Fellows, RN (Oncology Nurse Navigator) regarding the possible need for this exam. Ines Bloomer will review the patient's chart to determine if the patient truly qualifies for the exam. If the patient qualifies, Ines Bloomer will order the Low Dose CT of the chest to facilitate the scheduling of this exam.  Due for Mammogram. Completed 10/31/15/ Repeat every year. Ordered today. Message sent to referral coordinator for scheduling purposes.

## 2018-01-09 NOTE — Addendum Note (Signed)
Addended by: Duanne LimerickJONES, DEANNA C on: 01/09/2018 12:15 PM   Modules accepted: Orders

## 2018-01-10 ENCOUNTER — Telehealth: Payer: Self-pay | Admitting: *Deleted

## 2018-01-10 NOTE — Telephone Encounter (Signed)
Received referral for lung screening scan. Contacted patient and discussed screening process. Patient has had a recent CT scan of chest and does not need screening at this time. Will contact patient next year regarding lung cancer screening.

## 2018-02-20 ENCOUNTER — Ambulatory Visit: Payer: Medicare Other | Admitting: Gastroenterology

## 2018-02-20 NOTE — Progress Notes (Deleted)
Gastroenterology Consultation  Referring Provider:     Duanne Limerick, MD Primary Care Physician:  Duanne Limerick, MD Primary Gastroenterologist:  Dr. Servando Snare     Reason for Consultation:     GERD        HPI:   Sarah Mcdonald is a 62 y.o. y/o female referred for consultation & management of GERD by Dr. Yetta Barre, Vanita Panda, MD.  This patient comes to see me after seeing Dr. Yetta Barre as a new patient after moving here from [place].  The patient states that she had been on Dexilant in the past and reports that it does not seem to be working for her anymore.  She now reports that she has abdominal pain with radiation of the pain to her chest some choking coughing and belching.  In addition to this she also has heartburn and nausea.  She reports that her symptoms started approximately a year ago.  The symptoms will come and go and can be made worse with body position and certain foods.    Past Medical History:  Diagnosis Date  . ADHD   . Asthma   . Dyspnea   . Fibromyalgia   . GERD (gastroesophageal reflux disease)   . Vertigo     Past Surgical History:  Procedure Laterality Date  . ABDOMINAL HYSTERECTOMY    . BILATERAL CARPAL TUNNEL RELEASE    . CYST REMOVAL HAND    . ELBOW SURGERY    . KNEE SURGERY    . LIVER BIOPSY    . MOUTH SURGERY    . NOSE SURGERY     deviated septal repair  . OVARIAN CYST REMOVAL    . TONSILLECTOMY    . TUBAL LIGATION      Prior to Admission medications   Medication Sig Start Date End Date Taking? Authorizing Provider  albuterol (PROVENTIL HFA;VENTOLIN HFA) 108 (90 Base) MCG/ACT inhaler Inhale 2 puffs into the lungs every 6 (six) hours as needed for wheezing or shortness of breath. 01/08/18   Duanne Limerick, MD  cetirizine (ZYRTEC) 10 MG tablet Take 1 tablet (10 mg total) by mouth daily. 01/08/18   Duanne Limerick, MD  cyclobenzaprine (FLEXERIL) 10 MG tablet Take 1 tablet (10 mg total) by mouth 3 (three) times daily as needed for muscle spasms. 01/08/18   Duanne Limerick, MD  dexlansoprazole (DEXILANT) 60 MG capsule Take 1 capsule (60 mg total) by mouth daily. 01/08/18   Duanne Limerick, MD  sertraline (ZOLOFT) 25 MG tablet Take 1 tablet (25 mg total) by mouth daily. 01/09/18   Duanne Limerick, MD    Family History  Problem Relation Age of Onset  . Heart failure Mother   . Emphysema Mother   . Healthy Father   . Cancer Sister   . Cancer Brother   . Cancer Sister        throat  . COPD Brother   . Hepatitis C Brother   . Pneumonia Sister      Social History   Tobacco Use  . Smoking status: Former Smoker    Packs/day: 1.00    Years: 41.00    Pack years: 41.00    Types: Cigarettes    Last attempt to quit: 09/14/2014    Years since quitting: 3.4  . Smokeless tobacco: Never Used  . Tobacco comment: smoking cessation materials not required  Substance Use Topics  . Alcohol use: Yes    Alcohol/week: 7.2 oz    Types:  12 Cans of beer per week  . Drug use: No    Allergies as of 02/20/2018 - Review Complete 01/09/2018  Allergen Reaction Noted  . Seroquel [quetiapine fumarate] Other (See Comments) 12/20/2017  . Amitriptyline  12/15/2017    Review of Systems:    All systems reviewed and negative except where noted in HPI.   Physical Exam:  There were no vitals taken for this visit. No LMP recorded. Patient has had a hysterectomy. Psych:  Alert and cooperative. Normal mood and affect. General:   Alert,  Well-developed, well-nourished, pleasant and cooperative in NAD Head:  Normocephalic and atraumatic. Eyes:  Sclera clear, no icterus.   Conjunctiva pink. Ears:  Normal auditory acuity. Nose:  No deformity, discharge, or lesions. Mouth:  No deformity or lesions,oropharynx pink & moist. Neck:  Supple; no masses or thyromegaly. Lungs:  Respirations even and unlabored.  Clear throughout to auscultation.   No wheezes, crackles, or rhonchi. No acute distress. Heart:  Regular rate and rhythm; no murmurs, clicks, rubs, or gallops. Abdomen:   Normal bowel sounds.  No bruits.  Soft, non-tender and non-distended without masses, hepatosplenomegaly or hernias noted.  No guarding or rebound tenderness.  Negative Carnett sign.   Rectal:  Deferred.  Msk:  Symmetrical without gross deformities.  Good, equal movement & strength bilaterally. Pulses:  Normal pulses noted. Extremities:  No clubbing or edema.  No cyanosis. Neurologic:  Alert and oriented x3;  grossly normal neurologically. Skin:  Intact without significant lesions or rashes.  No jaundice. Lymph Nodes:  No significant cervical adenopathy. Psych:  Alert and cooperative. Normal mood and affect.  Imaging Studies: No results found.  Assessment and Plan:   Clide DeutscherLoren Hiney is a 62 y.o. y/o female ***  Midge Miniumarren Landy Dunnavant, MD. Clementeen GrahamFACG   Note: This dictation was prepared with Dragon dictation along with smaller phrase technology. Any transcriptional errors that result from this process are unintentional.

## 2018-02-21 ENCOUNTER — Ambulatory Visit (INDEPENDENT_AMBULATORY_CARE_PROVIDER_SITE_OTHER): Payer: Medicare Other | Admitting: Family Medicine

## 2018-02-21 ENCOUNTER — Encounter: Payer: Self-pay | Admitting: Family Medicine

## 2018-02-21 VITALS — BP 138/88 | HR 96 | Ht 61.0 in | Wt 121.0 lb

## 2018-02-21 DIAGNOSIS — F32A Depression, unspecified: Secondary | ICD-10-CM

## 2018-02-21 DIAGNOSIS — F329 Major depressive disorder, single episode, unspecified: Secondary | ICD-10-CM

## 2018-02-21 DIAGNOSIS — L2082 Flexural eczema: Secondary | ICD-10-CM | POA: Diagnosis not present

## 2018-02-21 DIAGNOSIS — F419 Anxiety disorder, unspecified: Secondary | ICD-10-CM | POA: Diagnosis not present

## 2018-02-21 MED ORDER — TRIAMCINOLONE ACETONIDE 0.1 % EX CREA: 1.0000 "application " | TOPICAL_CREAM | Freq: Two times a day (BID) | CUTANEOUS | 1 refills | Status: DC

## 2018-02-21 MED ORDER — HYDROXYZINE HCL 10 MG PO TABS: 10.0000 mg | ORAL_TABLET | Freq: Three times a day (TID) | ORAL | 1 refills | Status: DC | PRN

## 2018-02-21 MED ORDER — DEXLANSOPRAZOLE 60 MG PO CPDR
60.0000 mg | DELAYED_RELEASE_CAPSULE | Freq: Every day | ORAL | 1 refills | Status: DC
Start: 2018-02-21 — End: 2018-09-19

## 2018-02-21 MED ORDER — BUSPIRONE HCL 7.5 MG PO TABS: 7.5000 mg | ORAL_TABLET | Freq: Two times a day (BID) | ORAL | 1 refills | Status: DC

## 2018-02-21 MED ORDER — SERTRALINE HCL 25 MG PO TABS: 25.0000 mg | ORAL_TABLET | Freq: Every day | ORAL | 1 refills | Status: DC

## 2018-02-21 NOTE — Progress Notes (Signed)
Name: Sarah Mcdonald   MRN: [id number]    DOB: 1956/09/30   Date:02/21/2018       Progress Note  Subjective  Chief Complaint  Chief Complaint  Patient presents with  . Depression    everything is better  . skin itching    Zyrtec is not helping- allergic to dust mites.    Depression         This is a new problem.  The current episode started more than 1 year ago.   The onset quality is sudden.   The problem occurs every several days.  The problem has been gradually improving since onset.  Associated symptoms include no decreased concentration, no fatigue, no helplessness, no hopelessness, does not have insomnia, not irritable, no restlessness, no decreased interest, no appetite change, no body aches, no myalgias, no headaches, no indigestion, not sad and no suicidal ideas.  Past treatments include SSRIs - Selective serotonin reuptake inhibitors.  Compliance with treatment is good.  Past compliance problems include difficulty with treatment plan.  Previous treatment provided mild relief.   No problem-specific Assessment & Plan notes found for this encounter.   Past Medical History:  Diagnosis Date  . ADHD   . Asthma   . Dyspnea   . Fibromyalgia   . GERD (gastroesophageal reflux disease)   . Vertigo     Past Surgical History:  Procedure Laterality Date  . ABDOMINAL HYSTERECTOMY    . BILATERAL CARPAL TUNNEL RELEASE    . CYST REMOVAL HAND    . ELBOW SURGERY    . KNEE SURGERY    . LIVER BIOPSY    . MOUTH SURGERY    . NOSE SURGERY     deviated septal repair  . OVARIAN CYST REMOVAL    . TONSILLECTOMY    . TUBAL LIGATION      Family History  Problem Relation Age of Onset  . Heart failure Mother   . Emphysema Mother   . Healthy Father   . Cancer Sister   . Cancer Brother   . Cancer Sister        throat  . COPD Brother   . Hepatitis C Brother   . Pneumonia Sister     Social History   Socioeconomic History  . Marital status: Divorced    Spouse name: Not on file  .  Number of children: 3  . Years of education: some college  . Highest education level: 12th grade  Occupational History  . Occupation: Disabled  Social Needs  . Financial resource strain: Not hard at all  . Food insecurity:    Worry: Never true    Inability: Never true  . Transportation needs:    Medical: No    Non-medical: No  Tobacco Use  . Smoking status: Former Smoker    Packs/day: 1.00    Years: 41.00    Pack years: 41.00    Types: Cigarettes    Last attempt to quit: 09/14/2014    Years since quitting: 3.4  . Smokeless tobacco: Never Used  . Tobacco comment: smoking cessation materials not required  Substance and Sexual Activity  . Alcohol use: Yes    Alcohol/week: 7.2 oz    Types: 12 Cans of beer per week  . Drug use: No  . Sexual activity: Yes  Lifestyle  . Physical activity:    Days per week: 0 days    Minutes per session: 0 min  . Stress: Not at all  Relationships  . Social  connections:    Talks on phone: Patient refused    Gets together: Patient refused    Attends religious service: Patient refused    Active member of club or organization: Patient refused    Attends meetings of clubs or organizations: Patient refused    Relationship status: Divorced  . Intimate partner violence:    Fear of current or ex partner: No    Emotionally abused: No    Physically abused: No    Forced sexual activity: No  Other Topics Concern  . Not on file  Social History Narrative  . Not on file    Allergies  Allergen Reactions  . Seroquel [Quetiapine Fumarate] Other (See Comments)    Altered mental status  . Amitriptyline     Outpatient Medications Prior to Visit  Medication Sig Dispense Refill  . albuterol (PROVENTIL HFA;VENTOLIN HFA) 108 (90 Base) MCG/ACT inhaler Inhale 2 puffs into the lungs every 6 (six) hours as needed for wheezing or shortness of breath. 1 Inhaler 2  . cetirizine (ZYRTEC) 10 MG tablet Take 1 tablet (10 mg total) by mouth daily. 90 tablet 1  .  cyclobenzaprine (FLEXERIL) 10 MG tablet Take 1 tablet (10 mg total) by mouth 3 (three) times daily as needed for muscle spasms. 90 tablet 1  . dexlansoprazole (DEXILANT) 60 MG capsule Take 1 capsule (60 mg total) by mouth daily. 90 capsule 1  . sertraline (ZOLOFT) 25 MG tablet Take 1 tablet (25 mg total) by mouth daily. 30 tablet 1   No facility-administered medications prior to visit.     Review of Systems  Constitutional: Negative for appetite change, chills, fatigue, fever, malaise/fatigue and weight loss.  HENT: Negative for ear discharge, ear pain and sore throat.   Eyes: Negative for blurred vision.  Respiratory: Negative for cough, sputum production, shortness of breath and wheezing.   Cardiovascular: Negative for chest pain, palpitations and leg swelling.  Gastrointestinal: Negative for abdominal pain, blood in stool, constipation, diarrhea, heartburn, melena and nausea.  Genitourinary: Negative for dysuria, frequency, hematuria and urgency.  Musculoskeletal: Negative for back pain, joint pain, myalgias and neck pain.  Skin: Negative for rash.  Neurological: Negative for dizziness, tingling, sensory change, focal weakness and headaches.  Endo/Heme/Allergies: Negative for environmental allergies and polydipsia. Does not bruise/bleed easily.  Psychiatric/Behavioral: Positive for depression. Negative for decreased concentration and suicidal ideas. The patient is not nervous/anxious and does not have insomnia.      Objective  Vitals:   02/21/18 1345  BP: 138/88  Pulse: 96  Weight: 121 lb (54.9 kg)  Height: 5\' 1"  (1.549 m)    Physical Exam  Constitutional: She is oriented to person, place, and time. She appears well-developed and well-nourished. She is not irritable.  HENT:  Head: Normocephalic.  Right Ear: External ear normal.  Left Ear: External ear normal.  Mouth/Throat: Oropharynx is clear and moist.  Eyes: Pupils are equal, round, and reactive to light. Conjunctivae  and EOM are normal. Lids are everted and swept, no foreign bodies found. Left eye exhibits no hordeolum. No foreign body present in the left eye. Right conjunctiva is not injected. Left conjunctiva is not injected. No scleral icterus.  Neck: Normal range of motion. Neck supple. No JVD present. No tracheal deviation present. No thyromegaly present.  Cardiovascular: Normal rate, regular rhythm, normal heart sounds and intact distal pulses. Exam reveals no gallop and no friction rub.  No murmur heard. Pulmonary/Chest: Effort normal and breath sounds normal. No respiratory distress. She has no  wheezes. She has no rales.  Abdominal: Soft. Bowel sounds are normal. She exhibits no mass. There is no hepatosplenomegaly. There is no tenderness. There is no rebound and no guarding.  Musculoskeletal: Normal range of motion. She exhibits no edema or tenderness.  Lymphadenopathy:    She has no cervical adenopathy.  Neurological: She is alert and oriented to person, place, and time. She has normal strength. She displays normal reflexes. No cranial nerve deficit.  Skin: Skin is warm. No rash noted.  Psychiatric: She has a normal mood and affect. Her mood appears not anxious. She does not exhibit a depressed mood.  Nursing note and vitals reviewed.     Assessment & Plan  Problem List Items Addressed This Visit    None    Visit Diagnoses    Depression, unspecified depression type    -  Primary   Relevant Medications   sertraline (ZOLOFT) 25 MG tablet   busPIRone (BUSPAR) 7.5 MG tablet   dexlansoprazole (DEXILANT) 60 MG capsule   triamcinolone cream (KENALOG) 0.1 %   hydrOXYzine (ATARAX/VISTARIL) 10 MG tablet   Anxiety       Relevant Medications   sertraline (ZOLOFT) 25 MG tablet   busPIRone (BUSPAR) 7.5 MG tablet   hydrOXYzine (ATARAX/VISTARIL) 10 MG tablet   Flexural eczema       Relevant Medications   hydrOXYzine (ATARAX/VISTARIL) 10 MG tablet      Meds ordered this encounter  Medications   . sertraline (ZOLOFT) 25 MG tablet    Sig: Take 1 tablet (25 mg total) by mouth daily.    Dispense:  90 tablet    Refill:  1  . busPIRone (BUSPAR) 7.5 MG tablet    Sig: Take 1 tablet (7.5 mg total) by mouth 2 (two) times daily.    Dispense:  180 tablet    Refill:  1  . dexlansoprazole (DEXILANT) 60 MG capsule    Sig: Take 1 capsule (60 mg total) by mouth daily.    Dispense:  90 capsule    Refill:  1  . triamcinolone cream (KENALOG) 0.1 %    Sig: Apply 1 application topically 2 (two) times daily.    Dispense:  453.6 g    Refill:  1  . hydrOXYzine (ATARAX/VISTARIL) 10 MG tablet    Sig: Take 1 tablet (10 mg total) by mouth 3 (three) times daily as needed.    Dispense:  90 tablet    Refill:  1      Dr. Elizabeth Sauer [facility]  02/21/18

## 2018-03-01 ENCOUNTER — Other Ambulatory Visit: Payer: Self-pay

## 2018-03-01 ENCOUNTER — Telehealth: Payer: Self-pay

## 2018-03-01 DIAGNOSIS — F419 Anxiety disorder, unspecified: Secondary | ICD-10-CM

## 2018-03-01 NOTE — Telephone Encounter (Signed)
Pt called needing referral to psychiatry for "clonapin or xanax, that's the only meds that work for me. I can't even go out to the grocery store and we are almost out of food. My husband is having to stop by on his way home to get groceries." I put in referral to psych

## 2018-03-13 DIAGNOSIS — Z79899 Other long term (current) drug therapy: Secondary | ICD-10-CM | POA: Diagnosis not present

## 2018-03-13 DIAGNOSIS — F411 Generalized anxiety disorder: Secondary | ICD-10-CM | POA: Diagnosis not present

## 2018-03-13 DIAGNOSIS — F332 Major depressive disorder, recurrent severe without psychotic features: Secondary | ICD-10-CM | POA: Diagnosis not present

## 2018-04-04 ENCOUNTER — Ambulatory Visit: Payer: Medicare Other | Admitting: Gastroenterology

## 2018-04-04 ENCOUNTER — Other Ambulatory Visit: Payer: Self-pay

## 2018-04-04 NOTE — Progress Notes (Deleted)
Gastroenterology Consultation  Referring Provider:     Duanne LimerickJones, Deanna C, MD Primary Care Physician:  Duanne LimerickJones, Deanna C, MD Primary Gastroenterologist:  Dr. Servando SnareWohl     Reason for Consultation:     Reflux        HPI:   Sarah Mcdonald is a 62 y.o. y/o female referred for consultation & management of reflux by Dr. Duanne LimerickJones, Deanna C, MD.  This patient comes today with a history of reflux.  The patient has a history of depression.  The patient recently cannot go out of the house because the depression was so bad and had called her primary care provider for medication since she was running out of food in the house.  The patient was in the urgent care center back in February because she had run out of her Dexilant.  The patient had reported at that time that she was having worsening of her reflux and felt that her cough was also getting worse.  The patient has a history of smoking and had quit approximately 3 years ago.  Past Medical History:  Diagnosis Date  . ADHD   . Asthma   . Dyspnea   . Fibromyalgia   . GERD (gastroesophageal reflux disease)   . Vertigo     Past Surgical History:  Procedure Laterality Date  . ABDOMINAL HYSTERECTOMY    . BILATERAL CARPAL TUNNEL RELEASE    . CYST REMOVAL HAND    . ELBOW SURGERY    . KNEE SURGERY    . LIVER BIOPSY    . MOUTH SURGERY    . NOSE SURGERY     deviated septal repair  . OVARIAN CYST REMOVAL    . TONSILLECTOMY    . TUBAL LIGATION      Prior to Admission medications   Medication Sig Start Date End Date Taking? Authorizing Provider  albuterol (PROVENTIL HFA;VENTOLIN HFA) 108 (90 Base) MCG/ACT inhaler Inhale 2 puffs into the lungs every 6 (six) hours as needed for wheezing or shortness of breath. 01/08/18   Duanne LimerickJones, Deanna C, MD  busPIRone (BUSPAR) 7.5 MG tablet Take 1 tablet (7.5 mg total) by mouth 2 (two) times daily. 02/21/18   Duanne LimerickJones, Deanna C, MD  cetirizine (ZYRTEC) 10 MG tablet Take 1 tablet (10 mg total) by mouth daily. 01/08/18   Duanne LimerickJones,  Deanna C, MD  cyclobenzaprine (FLEXERIL) 10 MG tablet Take 1 tablet (10 mg total) by mouth 3 (three) times daily as needed for muscle spasms. 01/08/18   Duanne LimerickJones, Deanna C, MD  dexlansoprazole (DEXILANT) 60 MG capsule Take 1 capsule (60 mg total) by mouth daily. 02/21/18   Duanne LimerickJones, Deanna C, MD  diazepam (VALIUM) 5 MG tablet  03/13/18   [provider]  hydrOXYzine (ATARAX/VISTARIL) 10 MG tablet Take 1 tablet (10 mg total) by mouth 3 (three) times daily as needed. 02/21/18   Duanne LimerickJones, Deanna C, MD  hydrOXYzine (ATARAX/VISTARIL) 25 MG tablet  03/13/18   [provider]  sertraline (ZOLOFT) 100 MG tablet  03/13/18   [provider]  sertraline (ZOLOFT) 25 MG tablet Take 1 tablet (25 mg total) by mouth daily. 02/21/18   Duanne LimerickJones, Deanna C, MD  triamcinolone cream (KENALOG) 0.1 % Apply 1 application topically 2 (two) times daily. 02/21/18   Duanne LimerickJones, Deanna C, MD    Family History  Problem Relation Age of Onset  . Heart failure Mother   . Emphysema Mother   . Healthy Father   . Cancer Sister   . Cancer Brother   .  Cancer Sister        throat  . COPD Brother   . Hepatitis C Brother   . Pneumonia Sister      Social History   Tobacco Use  . Smoking status: Former Smoker    Packs/day: 1.00    Years: 41.00    Pack years: 41.00    Types: Cigarettes    Last attempt to quit: 09/14/2014    Years since quitting: 3.5  . Smokeless tobacco: Never Used  . Tobacco comment: smoking cessation materials not required  Substance Use Topics  . Alcohol use: Yes    Alcohol/week: 7.2 oz    Types: 12 Cans of beer per week  . Drug use: No    Allergies as of 04/04/2018 - Review Complete 02/21/2018  Allergen Reaction Noted  . Seroquel [quetiapine fumarate] Other (See Comments) 12/20/2017  . Amitriptyline  12/15/2017    Review of Systems:    All systems reviewed and negative except where noted in HPI.   Physical Exam:  There were no vitals taken for this visit. No LMP recorded. Patient has  had a hysterectomy. Psych:  Alert and cooperative. Normal mood and affect. General:   Alert,  Well-developed, well-nourished, pleasant and cooperative in NAD Head:  Normocephalic and atraumatic. Eyes:  Sclera clear, no icterus.   Conjunctiva pink. Ears:  Normal auditory acuity. Nose:  No deformity, discharge, or lesions. Mouth:  No deformity or lesions,oropharynx pink & moist. Neck:  Supple; no masses or thyromegaly. Lungs:  Respirations even and unlabored.  Clear throughout to auscultation.   No wheezes, crackles, or rhonchi. No acute distress. Heart:  Regular rate and rhythm; no murmurs, clicks, rubs, or gallops. Abdomen:  Normal bowel sounds.  No bruits.  Soft, non-tender and non-distended without masses, hepatosplenomegaly or hernias noted.  No guarding or rebound tenderness.  Negative Carnett sign.   Rectal:  Deferred.  Msk:  Symmetrical without gross deformities.  Good, equal movement & strength bilaterally. Pulses:  Normal pulses noted. Extremities:  No clubbing or edema.  No cyanosis. Neurologic:  Alert and oriented x3;  grossly normal neurologically. Skin:  Intact without significant lesions or rashes.  No jaundice. Lymph Nodes:  No significant cervical adenopathy. Psych:  Alert and cooperative. Normal mood and affect.  Imaging Studies: No results found.  Assessment and Plan:   Sarah Mcdonald is a 63 y.o. y/o female ***  Midge Minium, MD. Clementeen Graham   Note: This dictation was prepared with Dragon dictation along with smaller phrase technology. Any transcriptional errors that result from this process are unintentional.

## 2018-04-06 IMAGING — CT CT CHEST W/O CM
2 of 4 series · 15 of 36 positions shown, 18 images · non-contrast
Comparison: Chest radiograph 12/15/2017

CLINICAL DATA: Patient with cough.  Shortness of breath.

EXAM:
CT CHEST WITHOUT CONTRAST
TECHNIQUE: Multidetector CT imaging of the chest was performed following the
standard protocol without IV contrast.

[Series 3: chest · axial · 0.60mm/px · z∈[-1174,-892]mm · 12 of 167 slices shown, 15 images (1 of 2)]
[im 13/167  mediastinal]
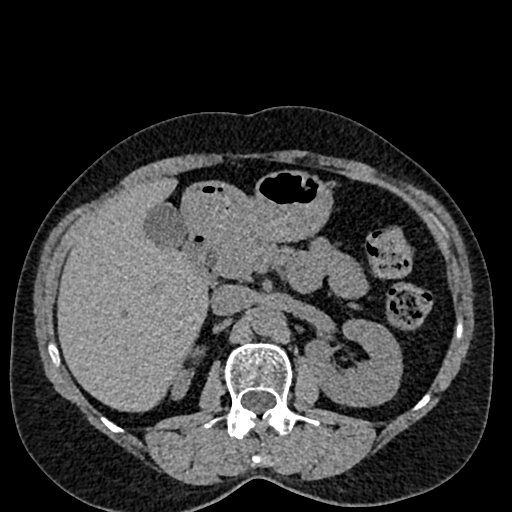
[im 13/167  lung]
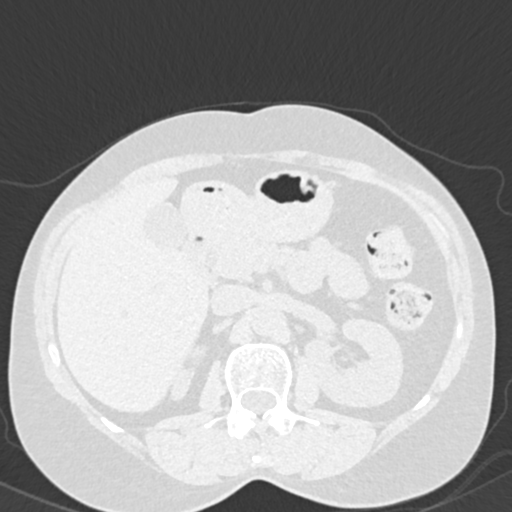
[im 26/167  lung]
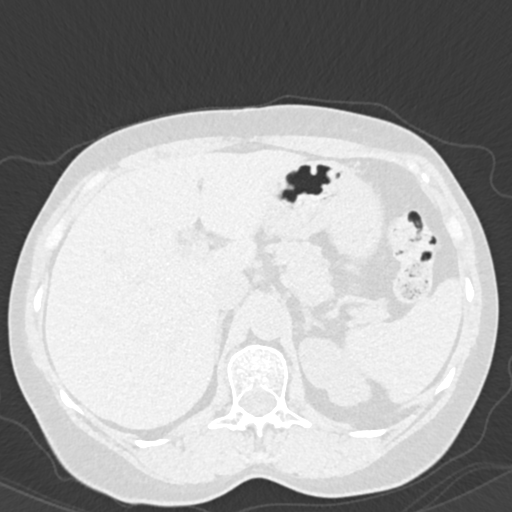
[im 39/167  lung]
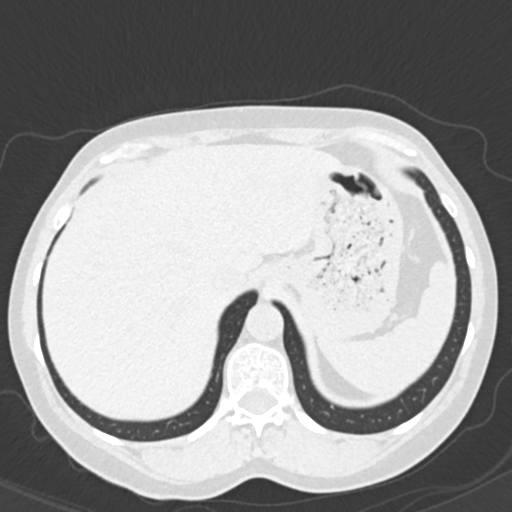
[im 52/167  lung]
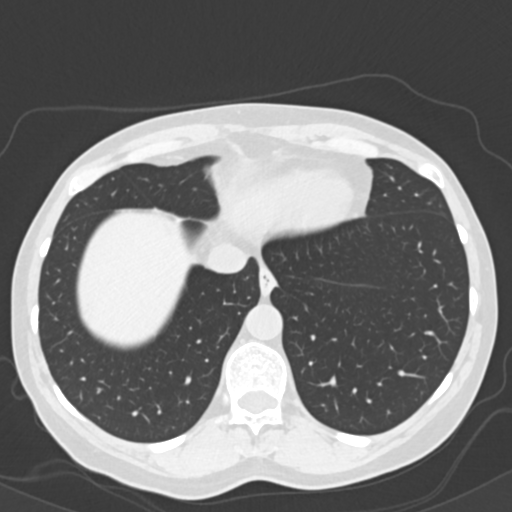
[im 64/167  mediastinal]
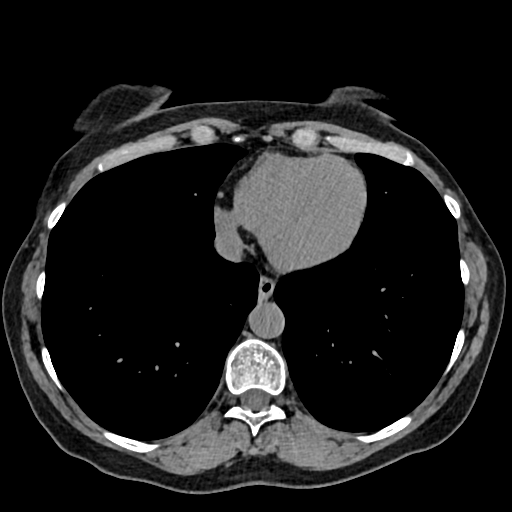
[im 64/167  lung]
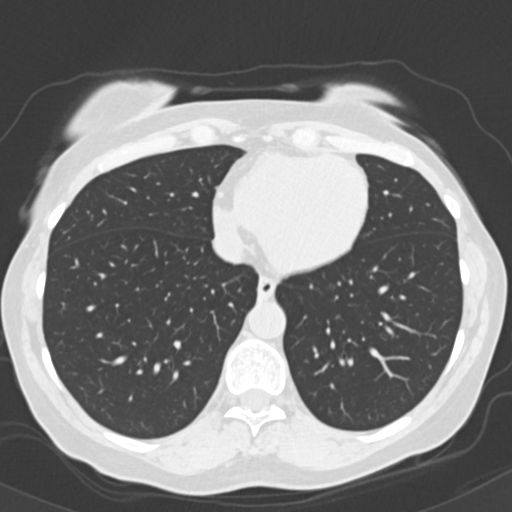
[im 77/167  lung]
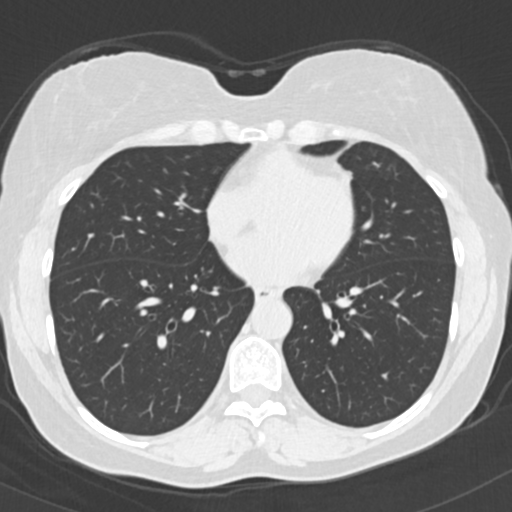
[im 90/167  lung]
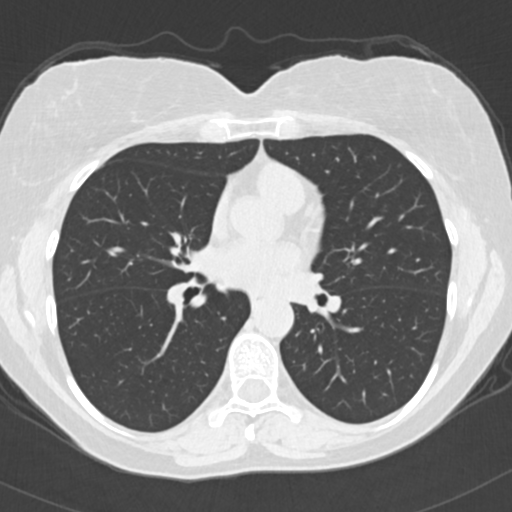
[im 103/167  lung]
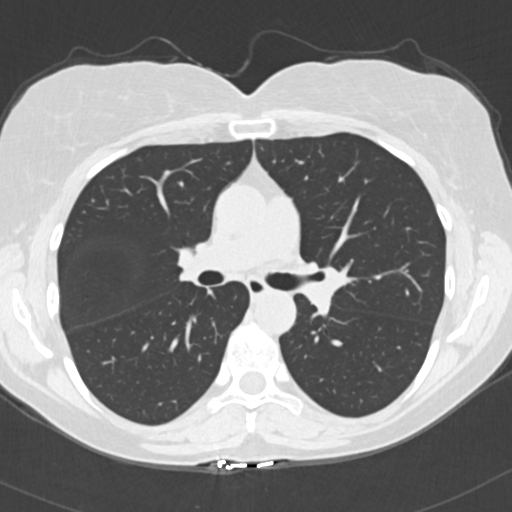
[im 115/167  mediastinal]
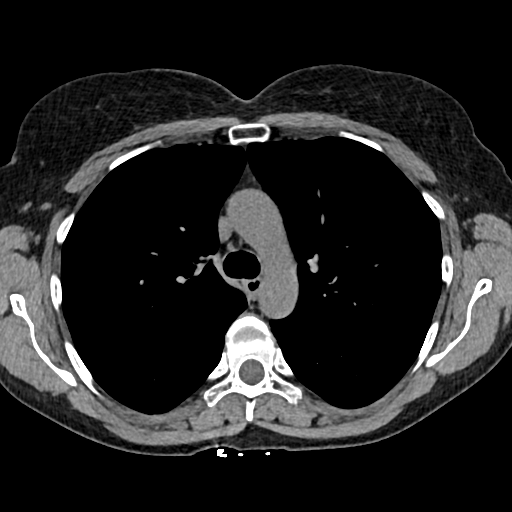
[im 115/167  lung]
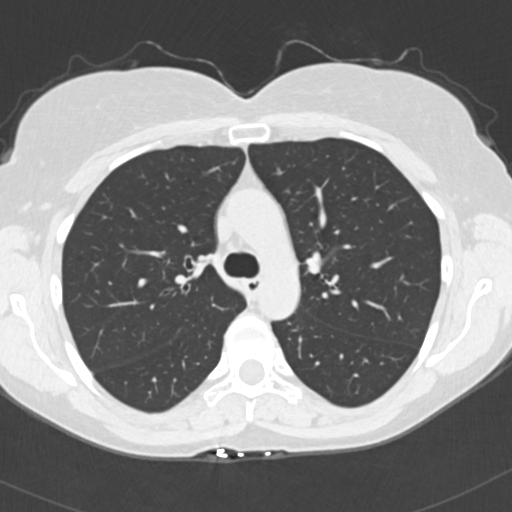
[im 128/167  lung]
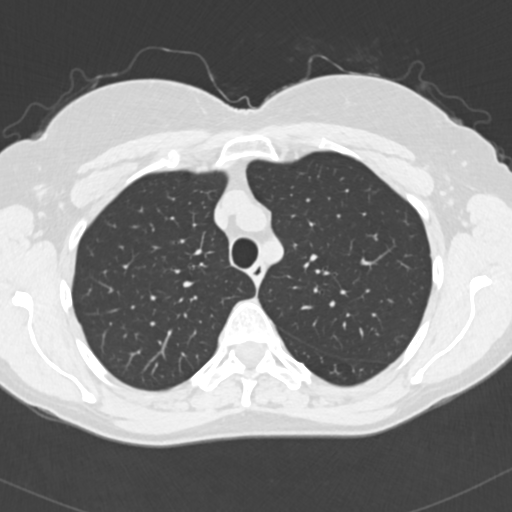
[im 141/167  lung]
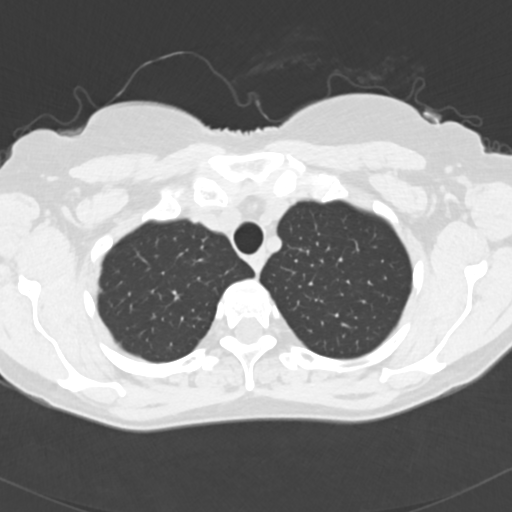
[im 154/167  lung]
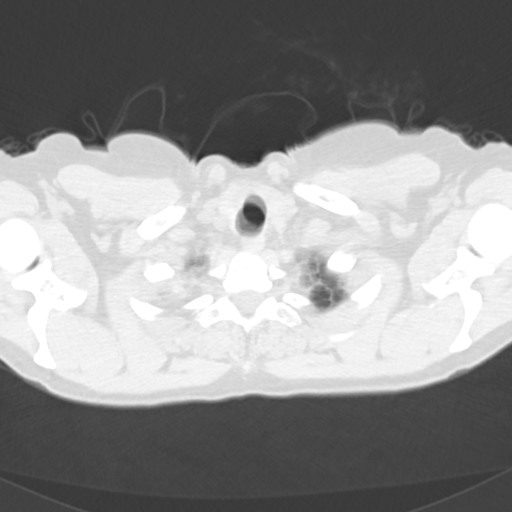

[Series 6: chest · coronal · 0.59mm/px · 3 of 118 slices shown (2 of 2)]
[im 24/118  lung]
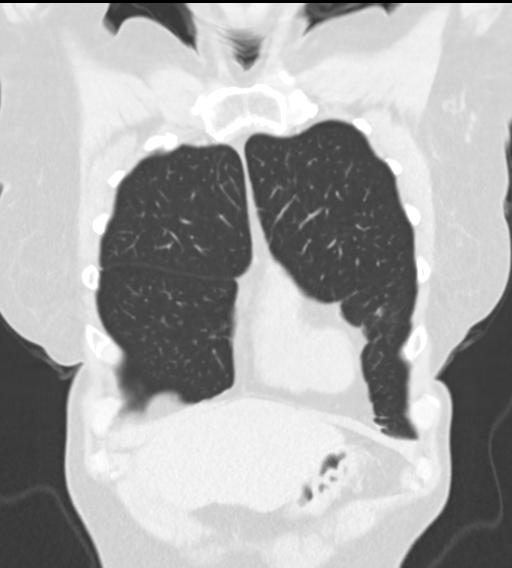
[im 47/118  lung]
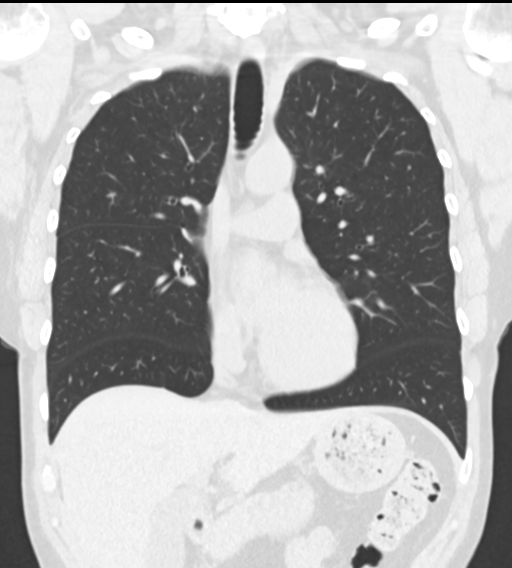
[im 71/118  lung]
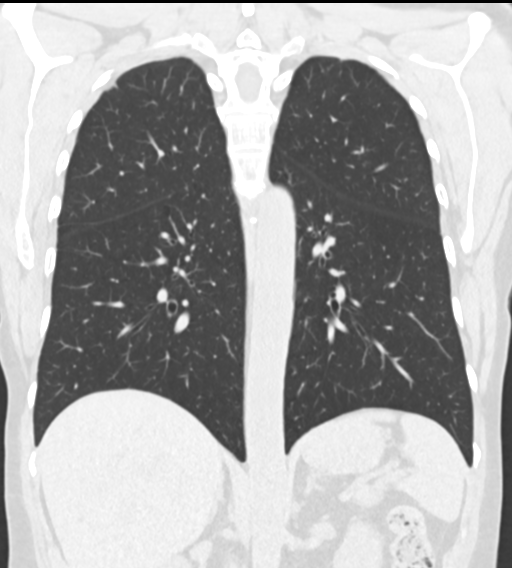

[15 of 36 positions shown; findings below may reference images not displayed]

FINDINGS: Cardiovascular: Normal heart size. No pericardial effusion. Thoracic
aortic vascular calcifications.

Mediastinum/Nodes: No enlarged axillary, mediastinal or hilar
lymphadenopathy. Normal esophagus.

Lungs/Pleura: Central airways are patent. No large area of pulmonary
consolidation. No pleural effusion or pneumothorax. 2 mm granuloma
in the lingula (image 80; series 4). 3 mm subpleural granuloma right
upper lobe (image 47; series 4). No pleural effusion or
pneumothorax. 3 mm noncalcified subpleural right upper lobe nodule
(image 56; series 4).

Upper Abdomen: Liver is normal in size and contour. Gallbladder is
unremarkable. No acute process. Atrophy of the right kidney.

Musculoskeletal: No aggressive or acute appearing osseous lesions.
IMPRESSION: 1. No acute process within the chest.
2. 3 mm subpleural nodule right upper lobe. No follow-up needed if
patient is low-risk. Non-contrast chest CT can be considered in 12
months if patient is high-risk. This recommendation follows the
consensus statement: Guidelines for Management of Incidental
Pulmonary Nodules Detected on CT Images: From the [HOSPITAL]

## 2018-04-10 DIAGNOSIS — F332 Major depressive disorder, recurrent severe without psychotic features: Secondary | ICD-10-CM | POA: Diagnosis not present

## 2018-04-10 DIAGNOSIS — F411 Generalized anxiety disorder: Secondary | ICD-10-CM | POA: Diagnosis not present

## 2018-05-08 DIAGNOSIS — F411 Generalized anxiety disorder: Secondary | ICD-10-CM | POA: Diagnosis not present

## 2018-05-08 DIAGNOSIS — Z79899 Other long term (current) drug therapy: Secondary | ICD-10-CM | POA: Diagnosis not present

## 2018-05-08 DIAGNOSIS — F332 Major depressive disorder, recurrent severe without psychotic features: Secondary | ICD-10-CM | POA: Diagnosis not present

## 2018-05-15 ENCOUNTER — Encounter: Payer: Self-pay | Admitting: Gastroenterology

## 2018-05-15 ENCOUNTER — Encounter (INDEPENDENT_AMBULATORY_CARE_PROVIDER_SITE_OTHER): Payer: Self-pay

## 2018-05-15 ENCOUNTER — Ambulatory Visit (INDEPENDENT_AMBULATORY_CARE_PROVIDER_SITE_OTHER): Payer: Medicare Other | Admitting: Gastroenterology

## 2018-05-15 VITALS — BP 117/41 | HR 70 | Ht 61.0 in | Wt 122.0 lb

## 2018-05-15 DIAGNOSIS — K219 Gastro-esophageal reflux disease without esophagitis: Secondary | ICD-10-CM

## 2018-05-15 NOTE — Progress Notes (Signed)
Gastroenterology Consultation  Referring Provider:     Duanne Limerick, MD Primary Care Physician:  Duanne Limerick, MD Primary Gastroenterologist:  Dr. Servando Snare     Reason for Consultation:     GERD        HPI:   Sarah Mcdonald is a 62 y.o. y/o female referred for consultation & management of GERD by Dr. Duanne Limerick, MD.  This patient comes to see me after [place] from up [place].  The patient states she had a colonoscopy approximately 9 years ago that was reported to be normal.  The patient also reports that she has been treated with Dexilant 30 mg a day for heartburn and ran out of her medication.  She went to see Dr. Yetta Barre for severe heartburn after being off the medication for 2 months.  The patient states that her chest discomfort and burning was excruciating and was recommended to come see me.  The patient was started at that time on Dexilant 60 mg and states that all of her symptoms have improved.  She also reports that she has very little acid breakthrough and it is usually related to her overeating or eating the wrong thing.  The patient reports that her mother died of lung disease and a heart attack when she was 45 patient's brother had cirrhosis. The patient states she drinks more than a 12 pack of beer a week and when questioned about it she states that she may be underestimating how much she really drinks.  She states she does not drink any hard alcohol.  There is no report of any black stools or bloody stools.  The patient also denies any unexplained weight loss and states she is gaining weight.  The patient also denies any dysphasia.  Past Medical History:  Diagnosis Date  . ADHD   . Asthma   . Dyspnea   . Fibromyalgia   . GERD (gastroesophageal reflux disease)   . Vertigo     Past Surgical History:  Procedure Laterality Date  . ABDOMINAL HYSTERECTOMY    . BILATERAL CARPAL TUNNEL RELEASE    . CYST REMOVAL HAND    . ELBOW SURGERY    . KNEE SURGERY    . LIVER  BIOPSY    . MOUTH SURGERY    . NOSE SURGERY     deviated septal repair  . OVARIAN CYST REMOVAL    . TONSILLECTOMY    . TUBAL LIGATION      Prior to Admission medications   Medication Sig Start Date End Date Taking? Authorizing Provider  albuterol (PROVENTIL HFA;VENTOLIN HFA) 108 (90 Base) MCG/ACT inhaler Inhale 2 puffs into the lungs every 6 (six) hours as needed for wheezing or shortness of breath. 01/08/18   Duanne Limerick, MD  busPIRone (BUSPAR) 7.5 MG tablet Take 1 tablet (7.5 mg total) by mouth 2 (two) times daily. 02/21/18   Duanne Limerick, MD  cetirizine (ZYRTEC) 10 MG tablet Take 1 tablet (10 mg total) by mouth daily. 01/08/18   Duanne Limerick, MD  cyclobenzaprine (FLEXERIL) 10 MG tablet Take 1 tablet (10 mg total) by mouth 3 (three) times daily as needed for muscle spasms. 01/08/18   Duanne Limerick, MD  dexlansoprazole (DEXILANT) 60 MG capsule Take 1 capsule (60 mg total) by mouth daily. 02/21/18   Duanne Limerick, MD  diazepam (VALIUM) 5 MG tablet  03/13/18   [provider]  hydrOXYzine (ATARAX/VISTARIL) 10 MG tablet Take 1 tablet (  10 mg total) by mouth 3 (three) times daily as needed. 02/21/18   Duanne Limerick, MD  hydrOXYzine (ATARAX/VISTARIL) 25 MG tablet  03/13/18   [provider]  sertraline (ZOLOFT) 100 MG tablet  03/13/18   [provider]  sertraline (ZOLOFT) 25 MG tablet Take 1 tablet (25 mg total) by mouth daily. 02/21/18   Duanne Limerick, MD  triamcinolone cream (KENALOG) 0.1 % Apply 1 application topically 2 (two) times daily. 02/21/18   Duanne Limerick, MD    Family History  Problem Relation Age of Onset  . Heart failure Mother   . Emphysema Mother   . Healthy Father   . Cancer Sister   . Cancer Brother   . Cancer Sister        throat  . COPD Brother   . Hepatitis C Brother   . Pneumonia Sister      Social History   Tobacco Use  . Smoking status: Former Smoker    Packs/day: 1.00    Years: 41.00    Pack years: 41.00    Types:  Cigarettes    Last attempt to quit: 09/14/2014    Years since quitting: 3.6  . Smokeless tobacco: Never Used  . Tobacco comment: smoking cessation materials not required  Substance Use Topics  . Alcohol use: Yes    Alcohol/week: 7.2 oz    Types: 12 Cans of beer per week  . Drug use: No    Allergies as of 05/15/2018 - Review Complete 02/21/2018  Allergen Reaction Noted  . Seroquel [quetiapine fumarate] Other (See Comments) 12/20/2017  . Amitriptyline  12/15/2017    Review of Systems:    All systems reviewed and negative except where noted in HPI.   Physical Exam:  There were no vitals taken for this visit. No LMP recorded. Patient has had a hysterectomy. General:   Alert,  Well-developed, well-nourished, pleasant and cooperative in NAD Head:  Normocephalic and atraumatic. Eyes:  Sclera clear, no icterus.   Conjunctiva pink. Ears:  Normal auditory acuity. Nose:  No deformity, discharge, or lesions. Mouth:  No deformity or lesions,oropharynx pink & moist. Neck:  Supple; no masses or thyromegaly. Lungs:  Respirations even and unlabored.  Clear throughout to auscultation.   No wheezes, crackles, or rhonchi. No acute distress. Heart:  Regular rate and rhythm; no murmurs, clicks, rubs, or gallops. Abdomen:  Normal bowel sounds.  No bruits.  Soft, non-tender and non-distended without masses, hepatosplenomegaly or hernias noted.  No guarding or rebound tenderness.  Negative Carnett sign.   Rectal:  Deferred.  Msk:  Symmetrical without gross deformities.  Good, equal movement & strength bilaterally. Pulses:  Normal pulses noted. Extremities:  No clubbing or edema.  No cyanosis. Neurologic:  Alert and oriented x3;  grossly normal neurologically. Skin:  Intact without significant lesions or rashes.  No jaundice. Lymph Nodes:  No significant cervical adenopathy. Psych:  Alert and cooperative. Normal mood and affect.  Imaging Studies: No results found.  Assessment and Plan:   [name] is a 62 y.o. y/o female who comes in with a history of significant heartburn after stopping her medication for 2 months.  The patient states that her heartburn has completely resolved except for some acid breakthrough when she does not eat the right foods.  The patient is due for colonoscopy next year and has been put on our callback schedule.  The patient also has no worry symptoms such as hematemesis black stools bloody stools or unexplained  weight loss.  She also does not have dysphasia.  The patient is doing well on her medication and has been told that since she is establishing care with me if she has any further GI problems she should contact my office.  For now I would continue the present medications and since she had a upper endoscopy within the last few years there is no need to repeat that at this time.  The patient has been explained the plan and agrees with it.  Midge Miniumarren Stonewall Doss, MD. Clementeen GrahamFACG    Note: This dictation was prepared with Dragon dictation along with smaller phrase technology. Any transcriptional errors that result from this process are unintentional.

## 2018-05-16 ENCOUNTER — Ambulatory Visit: Payer: Medicare Other | Admitting: Internal Medicine

## 2018-06-12 ENCOUNTER — Ambulatory Visit: Payer: Medicare Other | Admitting: Internal Medicine

## 2018-08-01 ENCOUNTER — Other Ambulatory Visit: Payer: Self-pay | Admitting: Family Medicine

## 2018-08-16 DIAGNOSIS — F4002 Agoraphobia without panic disorder: Secondary | ICD-10-CM | POA: Diagnosis not present

## 2018-08-16 DIAGNOSIS — F411 Generalized anxiety disorder: Secondary | ICD-10-CM | POA: Diagnosis not present

## 2018-08-16 DIAGNOSIS — Z79899 Other long term (current) drug therapy: Secondary | ICD-10-CM | POA: Diagnosis not present

## 2018-08-16 DIAGNOSIS — F332 Major depressive disorder, recurrent severe without psychotic features: Secondary | ICD-10-CM | POA: Diagnosis not present

## 2018-08-20 ENCOUNTER — Ambulatory Visit (INDEPENDENT_AMBULATORY_CARE_PROVIDER_SITE_OTHER): Payer: Medicare Other | Admitting: Internal Medicine

## 2018-08-20 ENCOUNTER — Encounter: Payer: Self-pay | Admitting: Internal Medicine

## 2018-08-20 VITALS — BP 122/80 | HR 66 | Ht 61.5 in | Wt 129.0 lb

## 2018-08-20 DIAGNOSIS — J449 Chronic obstructive pulmonary disease, unspecified: Secondary | ICD-10-CM

## 2018-08-20 DIAGNOSIS — Z23 Encounter for immunization: Secondary | ICD-10-CM

## 2018-08-20 NOTE — Progress Notes (Signed)
Name: Sarah Mcdonald MRN: [id number] DOB: Jul 13, 1956     CONSULTATION DATE: 2.21.19 REFERRING MD :    CHIEF COMPLAINT:  abnormal CXR STUDIES:  CXR 11/2017 I have Independently reviewed images of  CXR   on 08/20/2018 Interpretation: I do NOT see any obvious abnormalities No masses or pneumonia     HISTORY OF PRESENT ILLNESS:   Patient here for follow-up for her COPD Patient has chronic shortness of breath with dyspnea on exertion No active wheezing No cough No signs of infection  Has a pretty extensive smoking history 40-pack-year but is quit in November 2015  Patient does not have any acute respiratory symptoms at this time  PFTs reviewed with patient Ratio was 69% predicted with the FEV1 of 103% predicted Findings suggest mild COPD  Patient does not use any inhalers Just uses albuterol inhaler as needed  Review of Systems:  Gen:  Denies  fever, sweats, chills weigh loss  HEENT: Denies blurred vision, double vision, ear pain, eye pain, hearing loss, nose bleeds, sore throat Cardiac:  No dizziness, chest pain or heaviness, chest tightness,edema, No JVD Resp:    +shortness of breath,-wheezing, -hemoptysis,  Gi: Denies swallowing difficulty, stomach pain, nausea or vomiting, diarrhea, constipation, bowel incontinence Gu:  Denies bladder incontinence, burning urine Ext:   Denies Joint pain, stiffness or swelling Skin: Denies  skin rash, easy bruising or bleeding or hives Endoc:  Denies polyuria, polydipsia , polyphagia or weight change Psych:   Denies depression, insomnia or hallucinations  Other:  All other systems negative   ALL OTHER ROS ARE NEGATIVE  BP 122/80 (BP Location: Left Arm, Cuff Size: Normal)   Pulse 66   Ht 5' 1.5" (1.562 m)   Wt 129 lb (58.5 kg)   SpO2 95%   BMI 23.98 kg/m   Physical Examination:   GENERAL:NAD, no fevers, chills, no weakness no fatigue HEAD: Normocephalic, atraumatic.  EYES: Pupils equal, round, reactive to light.  Extraocular muscles intact. No scleral icterus.  MOUTH: Moist mucosal membrane. Dentition intact. No abscess noted.  EAR, NOSE, THROAT: Clear without exudates. No external lesions.  NECK: Supple. No thyromegaly. No nodules. No JVD.  PULMONARY: CTA B/L no wheezing, rhonchi, crackles CARDIOVASCULAR: S1 and S2. Regular rate and rhythm. No murmurs, rubs, or gallops. No edema. Pedal pulses 2+ bilaterally.  GASTROINTESTINAL: Soft, nontender, nondistended. No masses. Positive bowel sounds. No hepatosplenomegaly.  MUSCULOSKELETAL: No swelling, clubbing, or edema. Range of motion full in all extremities.  NEUROLOGIC: Cranial nerves II through XII are intact. No gross focal neurological deficits. Sensation intact. Reflexes intact.  SKIN: No ulceration, lesions, rashes, or cyanosis. Skin warm and dry. Turgor intact.  PSYCHIATRIC: Mood, affect within normal limits. The patient is awake, alert and oriented x 3. Insight, judgment intact.  ALL OTHER ROS ARE NEGATIVE         ASSESSMENT / PLAN:  62 year old pleasant white female seen today for extensive smoking history with abnormal PFTs reviewed with the patient consistent with mild COPD based on FEV1 along with gold stage a symptoms based on her clinical signs and symptoms, she also has a 3 mm nodule in the right upper lobe that seems to be benign at this time no further recommendations at this time.  After further evaluation her COPD is mild and does not need any additional inhalers at this time just use her albuterol as needed  Flu shot given today  Patient satisfied with Plan of action and management. All questions answered Follow-up in 1 year  Corrin Parker, M.D.  [facility]  Medical Director [place] Director [facility] Cardio-Pulmonary Department

## 2018-08-20 NOTE — Patient Instructions (Addendum)
ALBUTEROL AS NEEDED  FOLLOW UP IN 1 year   FLU SHOT TODAY!

## 2018-08-26 ENCOUNTER — Ambulatory Visit: Payer: Medicare Other | Admitting: Family Medicine

## 2018-09-19 ENCOUNTER — Other Ambulatory Visit: Payer: Self-pay | Admitting: Family Medicine

## 2018-09-19 DIAGNOSIS — F32A Depression, unspecified: Secondary | ICD-10-CM

## 2018-09-19 DIAGNOSIS — L2082 Flexural eczema: Secondary | ICD-10-CM

## 2018-09-19 DIAGNOSIS — F329 Major depressive disorder, single episode, unspecified: Secondary | ICD-10-CM

## 2018-09-19 DIAGNOSIS — J449 Chronic obstructive pulmonary disease, unspecified: Secondary | ICD-10-CM

## 2018-10-15 DIAGNOSIS — F4002 Agoraphobia without panic disorder: Secondary | ICD-10-CM | POA: Diagnosis not present

## 2018-10-15 DIAGNOSIS — F332 Major depressive disorder, recurrent severe without psychotic features: Secondary | ICD-10-CM | POA: Diagnosis not present

## 2018-10-15 DIAGNOSIS — F411 Generalized anxiety disorder: Secondary | ICD-10-CM | POA: Diagnosis not present

## 2018-11-13 ENCOUNTER — Other Ambulatory Visit: Payer: Self-pay | Admitting: Family Medicine

## 2018-11-14 ENCOUNTER — Other Ambulatory Visit: Payer: Self-pay

## 2018-11-14 DIAGNOSIS — F329 Major depressive disorder, single episode, unspecified: Secondary | ICD-10-CM

## 2018-11-14 DIAGNOSIS — F32A Depression, unspecified: Secondary | ICD-10-CM

## 2018-11-14 MED ORDER — DEXLANSOPRAZOLE 60 MG PO CPDR: 1.0000 | DELAYED_RELEASE_CAPSULE | Freq: Every day | ORAL | 0 refills | Status: DC

## 2018-11-14 MED ORDER — CETIRIZINE HCL 10 MG PO TABS: 10.0000 mg | ORAL_TABLET | Freq: Every day | ORAL | 0 refills | Status: DC

## 2018-11-28 DIAGNOSIS — F411 Generalized anxiety disorder: Secondary | ICD-10-CM | POA: Diagnosis not present

## 2018-11-28 DIAGNOSIS — Z79899 Other long term (current) drug therapy: Secondary | ICD-10-CM | POA: Diagnosis not present

## 2018-11-28 DIAGNOSIS — F4002 Agoraphobia without panic disorder: Secondary | ICD-10-CM | POA: Diagnosis not present

## 2018-11-28 DIAGNOSIS — F332 Major depressive disorder, recurrent severe without psychotic features: Secondary | ICD-10-CM | POA: Diagnosis not present

## 2018-12-23 ENCOUNTER — Telehealth: Payer: Self-pay | Admitting: *Deleted

## 2018-12-23 NOTE — Telephone Encounter (Signed)
Contacted regarding lung screening scan. Patient requests to be contacted in 1 month due to transportation issues.

## 2019-01-07 ENCOUNTER — Telehealth: Payer: Self-pay | Admitting: *Deleted

## 2019-01-07 DIAGNOSIS — Z87891 Personal history of nicotine dependence: Secondary | ICD-10-CM

## 2019-01-07 DIAGNOSIS — Z122 Encounter for screening for malignant neoplasm of respiratory organs: Secondary | ICD-10-CM

## 2019-01-07 NOTE — Telephone Encounter (Signed)
Received referral for initial lung cancer screening scan. Contacted patient and obtained smoking history,(former, quit 09/14/14, 41 pack year) as well as answering questions related to screening process. Patient denies signs of lung cancer such as weight loss or hemoptysis. Patient denies comorbidity that would prevent curative treatment if lung cancer were found. Patient is scheduled for shared decision making visit and CT scan on 01/23/19 at 145.

## 2019-01-13 ENCOUNTER — Ambulatory Visit (INDEPENDENT_AMBULATORY_CARE_PROVIDER_SITE_OTHER): Payer: Medicare Other

## 2019-01-13 ENCOUNTER — Other Ambulatory Visit: Payer: Self-pay

## 2019-01-13 VITALS — BP 110/70 | HR 68 | Temp 97.5°F | Resp 16 | Ht 62.0 in | Wt 132.0 lb

## 2019-01-13 DIAGNOSIS — Z0000 Encounter for general adult medical examination without abnormal findings: Secondary | ICD-10-CM | POA: Diagnosis not present

## 2019-01-13 DIAGNOSIS — Z1211 Encounter for screening for malignant neoplasm of colon: Secondary | ICD-10-CM | POA: Diagnosis not present

## 2019-01-13 NOTE — Patient Instructions (Addendum)
Sarah Mcdonald , Thank you for taking time to come for your Medicare Wellness Visit. I appreciate your ongoing commitment to your health goals. Please review the following plan we discussed and let me know if I can assist you in the future.   Screening recommendations/referrals: Colonoscopy: Referral placed to gastroenterology today for repeat screening Mammogram: Please call [phone] to schedule your mammogram.   Recommended yearly ophthalmology/optometry visit for glaucoma screening and checkup Recommended yearly dental visit for hygiene and checkup  Vaccinations: Influenza vaccine: done 08/20/18 Pneumococcal vaccine: due age 30 Tdap vaccine: done 03/30/2010 Shingles vaccine: Shingrix discussed. Please contact your pharmacy for coverage information.   Advanced directives: Advance directive discussed with you today. I have provided a copy for you to complete at home and have notarized. Once this is complete please bring a copy in to our office so we can scan it into your chart.  Conditions/risks identified: Recommend healthy eating and physical activity for desired weight loss.   Next appointment: Please follow up in one year for your Medicare Annual Wellness visit.   [place] [phone] www.[email or web address] Services Include: Housing counseling, home ownership, senior nutrition, asset development, emergency food, crisis information and referral, self sufficiency, and LIEAP programs.  [place] on Wheels [phone] www.[email or web address] Services Include: Home-delivered meals, frozen home-delivered meals, and winter emergency meals. (Note: Call for more information on additional supportive services.) [place] [phone]  www.alliedchurches.org Services Include: Job and education services, shelter, meals and emergency food. Weekday lunch and dinner meal program for shelter  residents and community members (hot breakfast daily for shelter residents).  [employer] [phone]  www. RecruitSuit.co.za Services Include: Building surveyor. [facility] [employer] [phone] [email or web address] Services Include: WEEKEND Meals Saturdays 11:30 am - 12:30 pm and Sundays 12:30 - 1:30 pm.  Division of Social Services Special Nutrition Assistance Program (SNAP) Food Stamps Program [phone]  [phone] https://[email or web address] food-stamps Services Include: Nutrition supportive programs (Note: Food Stamps.)  [facility] [place], [place] We offer both emergency food assistance and also have a monthly food assistance program. Our monthly program signup day and times are: Monday 10:00am - 12:00pm; 7:00pm - 9:00pmFriday 10:00am - 12:00pmFood distribution is base on first come first serve basis Call if you have any questions.  [place] [phone]  www.hbcburlington.[email or web address] Services Include: Food pantry Wednesdays 10 am - Noon.  [facility] ([facility]) [phone]   [address] [place], [place] [address] Tuesdays and Saturdays - Please be there at 9 AM to receive food, closed the first Saturday of every month.  [employer] [phone]  www.salvationarmy.org Services Include: Utility assistance, food pantry, life-sustaining medicine, and rental assistance (when funds are available   Preventive Care 40-64 Years, Female Preventive care refers to lifestyle choices and visits with your health care provider that can promote health and wellness. What does preventive care include?  A yearly physical exam. This is also called an annual well check.  Dental exams once or twice a year.  Routine eye exams. Ask your health care provider how often you should have your eyes checked.  Personal  lifestyle choices, including:  Daily care of your teeth and gums.  Regular physical activity.  Eating a healthy diet.  Avoiding tobacco and drug use.  Limiting alcohol use.  Practicing safe sex.  Taking low-dose aspirin daily starting at age 43.  Taking vitamin  and mineral supplements as recommended by your health care provider. What happens during an annual well check? The services and screenings done by your health care provider during your annual well check will depend on your age, overall health, lifestyle risk factors, and family history of disease. Counseling  Your health care provider may ask you questions about your:  Alcohol use.  Tobacco use.  Drug use.  Emotional well-being.  Home and relationship well-being.  Sexual activity.  Eating habits.  Work and work Statistician.  Method of birth control.  Menstrual cycle.  Pregnancy history. Screening  You may have the following tests or measurements:  Height, weight, and BMI.  Blood pressure.  Lipid and cholesterol levels. These may be checked every 5 years, or more frequently if you are over 16 years old.  Skin check.  Lung cancer screening. You may have this screening every year starting at age 63 if you have a 30-pack-year history of smoking and currently smoke or have quit within the past 15 years.  Fecal occult blood test (FOBT) of the stool. You may have this test every year starting at age 39.  Flexible sigmoidoscopy or colonoscopy. You may have a sigmoidoscopy every 5 years or a colonoscopy every 10 years starting at age 30.  Hepatitis C blood test.  Hepatitis B blood test.  Sexually transmitted disease (STD) testing.  Diabetes screening. This is done by checking your blood sugar (glucose) after you have not eaten for a while (fasting). You may have this done every 1-3 years.  Mammogram. This may be done every 1-2 years. Talk to your health care provider about when you should start having  regular mammograms. This may depend on whether you have a family history of breast cancer.  BRCA-related cancer screening. This may be done if you have a family history of breast, ovarian, tubal, or peritoneal cancers.  Pelvic exam and Pap test. This may be done every 3 years starting at age 6. Starting at age 3, this may be done every 5 years if you have a Pap test in combination with an HPV test.  Bone density scan. This is done to screen for osteoporosis. You may have this scan if you are at high risk for osteoporosis. Discuss your test results, treatment options, and if necessary, the need for more tests with your health care provider. Vaccines  Your health care provider may recommend certain vaccines, such as:  Influenza vaccine. This is recommended every year.  Tetanus, diphtheria, and acellular pertussis (Tdap, Td) vaccine. You may need a Td booster every 10 years.  Zoster vaccine. You may need this after age 67.  Pneumococcal 13-valent conjugate (PCV13) vaccine. You may need this if you have certain conditions and were not previously vaccinated.  Pneumococcal polysaccharide (PPSV23) vaccine. You may need one or two doses if you smoke cigarettes or if you have certain conditions. Talk to your health care provider about which screenings and vaccines you need and how often you need them. This information is not intended to replace advice given to you by your health care provider. Make sure you discuss any questions you have with your health care provider. Document Released: 11/12/2015 Document Revised: 07/05/2016 Document Reviewed: 08/17/2015 Elsevier Interactive Patient Education  2017 [place] Prevention in the Home Falls can cause injuries. They can happen to people of all ages. There are many things you can do to make your home safe and to help prevent falls. What can I do  on the outside of my home?  Regularly fix the edges of walkways and driveways and fix any  cracks.  Remove anything that might make you trip as you walk through a door, such as a raised step or threshold.  Trim any bushes or trees on the path to your home.  Use bright outdoor lighting.  Clear any walking paths of anything that might make someone trip, such as rocks or tools.  Regularly check to see if handrails are loose or broken. Make sure that both sides of any steps have handrails.  Any raised decks and porches should have guardrails on the edges.  Have any leaves, snow, or ice cleared regularly.  Use sand or salt on walking paths during winter.  Clean up any spills in your garage right away. This includes oil or grease spills. What can I do in the bathroom?  Use night lights.  Install grab bars by the toilet and in the tub and shower. Do not use towel bars as grab bars.  Use non-skid mats or decals in the tub or shower.  If you need to sit down in the shower, use a plastic, non-slip stool.  Keep the floor dry. Clean up any water that spills on the floor as soon as it happens.  Remove soap buildup in the tub or shower regularly.  Attach bath mats securely with double-sided non-slip rug tape.  Do not have throw rugs and other things on the floor that can make you trip. What can I do in the bedroom?  Use night lights.  Make sure that you have a light by your bed that is easy to reach.  Do not use any sheets or blankets that are too big for your bed. They should not hang down onto the floor.  Have a firm chair that has side arms. You can use this for support while you get dressed.  Do not have throw rugs and other things on the floor that can make you trip. What can I do in the kitchen?  Clean up any spills right away.  Avoid walking on wet floors.  Keep items that you use a lot in easy-to-reach places.  If you need to reach something above you, use a strong step stool that has a grab bar.  Keep electrical cords out of the way.  Do not use floor  polish or wax that makes floors slippery. If you must use wax, use non-skid floor wax.  Do not have throw rugs and other things on the floor that can make you trip. What can I do with my stairs?  Do not leave any items on the stairs.  Make sure that there are handrails on both sides of the stairs and use them. Fix handrails that are broken or loose. Make sure that handrails are as long as the stairways.  Check any carpeting to make sure that it is firmly attached to the stairs. Fix any carpet that is loose or worn.  Avoid having throw rugs at the top or bottom of the stairs. If you do have throw rugs, attach them to the floor with carpet tape.  Make sure that you have a light switch at the top of the stairs and the bottom of the stairs. If you do not have them, ask someone to add them for you. What else can I do to help prevent falls?  Wear shoes that:  Do not have high heels.  Have rubber bottoms.  Are comfortable and  fit you well.  Are closed at the toe. Do not wear sandals.  If you use a stepladder:  Make sure that it is fully opened. Do not climb a closed stepladder.  Make sure that both sides of the stepladder are locked into place.  Ask someone to hold it for you, if possible.  Clearly mark and make sure that you can see:  Any grab bars or handrails.  First and last steps.  Where the edge of each step is.  Use tools that help you move around (mobility aids) if they are needed. These include:  Canes.  Walkers.  Scooters.  Crutches.  Turn on the lights when you go into a dark area. Replace any light bulbs as soon as they burn out.  Set up your furniture so you have a clear path. Avoid moving your furniture around.  If any of your floors are uneven, fix them.  If there are any pets around you, be aware of where they are.  Review your medicines with your doctor. Some medicines can make you feel dizzy. This can increase your chance of falling. Ask your  doctor what other things that you can do to help prevent falls. This information is not intended to replace advice given to you by your health care provider. Make sure you discuss any questions you have with your health care provider. Document Released: 08/12/2009 Document Revised: 03/23/2016 Document Reviewed: 11/20/2014 Elsevier Interactive Patient Education  2017 [employer].

## 2019-01-13 NOTE — Progress Notes (Signed)
Subjective:   Sarah Mcdonald is a 63 y.o. female who presents for Medicare Annual (Subsequent) preventive examination.  Review of Systems:   Cardiac Risk Factors include: none     Objective:     Vitals: BP 110/70 (BP Location: Left Arm, Patient Position: Sitting, Cuff Size: Normal)   Pulse 68   Temp (!) 97.5 F (36.4 C) (Oral)   Resp 16   Ht 5\' 2"  (1.575 m)   Wt 132 lb (59.9 kg)   SpO2 93%   BMI 24.14 kg/m   Body mass index is 24.14 kg/m.  Advanced Directives 01/13/2019 01/09/2018 12/15/2017  Does Patient Have a Medical Advance Directive? Yes No No  Type of Advance Directive Living will;Healthcare Power of Attorney - -  Does patient want to make changes to medical advance directive? Yes (MAU/Ambulatory/Procedural Areas - Information given) - -  Copy of Healthcare Power of Attorney in Chart? No - copy requested - -  Would patient like information on creating a medical advance directive? - Yes (MAU/Ambulatory/Procedural Areas - Information given) -    Tobacco Social History   Tobacco Use  Smoking Status Former Smoker  . Packs/day: 1.00  . Years: 41.00  . Pack years: 41.00  . Types: Cigarettes  . Last attempt to quit: 09/14/2014  . Years since quitting: 4.3  Smokeless Tobacco Never Used  Tobacco Comment   smoking cessation materials not required     Counseling given: Not Answered Comment: smoking cessation materials not required   Clinical Intake:  Pre-visit preparation completed: Yes  Pain : No/denies pain     BMI - recorded: 24.14 Nutritional Status: BMI of 19-24  Normal Nutritional Risks: None Diabetes: No  How often do you need to have someone help you when you read instructions, pamphlets, or other written materials from your doctor or pharmacy?: 1 - Never What is the last grade level you completed in school?: 12th grade  Interpreter Needed?: No  Information entered by :: Reather Littler LPN  Past Medical History:  Diagnosis Date  . ADHD   . ADHD    . Allergy   . Anxiety   . Asthma   . Back pain   . COPD (chronic obstructive pulmonary disease) (HCC)   . Depression   . Dyspnea   . Eczema   . Fibromyalgia   . GERD (gastroesophageal reflux disease)   . History of hepatitis C   . Vertigo    Past Surgical History:  Procedure Laterality Date  . ABDOMINAL HYSTERECTOMY    . BILATERAL CARPAL TUNNEL RELEASE    . CYST REMOVAL HAND    . ELBOW SURGERY    . KNEE SURGERY    . LIVER BIOPSY    . MOUTH SURGERY    . NOSE SURGERY     deviated septal repair  . OVARIAN CYST REMOVAL    . TONSILLECTOMY    . TUBAL LIGATION     Family History  Problem Relation Age of Onset  . Heart failure Mother   . Emphysema Mother   . Obesity Mother   . Healthy Father   . Cancer Sister   . Cancer Brother   . Cancer Sister        throat  . COPD Brother   . Hepatitis C Brother   . Pneumonia Sister    Social History   Socioeconomic History  . Marital status: Divorced    Spouse name: Not on file  . Number of children: 3  . Years  of education: some college  . Highest education level: 12th grade  Occupational History  . Occupation: Disabled  Social Needs  . Financial resource strain: Not very hard  . Food insecurity:    Worry: Never true    Inability: Never true  . Transportation needs:    Medical: No    Non-medical: No  Tobacco Use  . Smoking status: Former Smoker    Packs/day: 1.00    Years: 41.00    Pack years: 41.00    Types: Cigarettes    Last attempt to quit: 09/14/2014    Years since quitting: 4.3  . Smokeless tobacco: Never Used  . Tobacco comment: smoking cessation materials not required  Substance and Sexual Activity  . Alcohol use: Yes    Alcohol/week: 6.0 standard drinks    Types: 6 Cans of beer per week    Comment: 6 pack per week  . Drug use: No  . Sexual activity: Yes  Lifestyle  . Physical activity:    Days per week: 7 days    Minutes per session: 30 min  . Stress: Only a little  Relationships  . Social  connections:    Talks on phone: More than three times a week    Gets together: Three times a week    Attends religious service: Never    Active member of club or organization: No    Attends meetings of clubs or organizations: Never    Relationship status: Divorced  Other Topics Concern  . Not on file  Social History Narrative  . Not on file    Outpatient Encounter Medications as of 01/13/2019  Medication Sig  . busPIRone (BUSPAR) 15 MG tablet Take 15 mg by mouth 2 (two) times daily.  . cetirizine (EQ ALLERGY RELIEF, CETIRIZINE,) 10 MG tablet Take 1 tablet (10 mg total) by mouth daily.  . clonazePAM (KLONOPIN) 1 MG tablet Take 1 mg by mouth 2 (two) times daily.  . cyclobenzaprine (FLEXERIL) 10 MG tablet Take 1 tablet (10 mg total) by mouth 3 (three) times daily as needed for muscle spasms.  Marland Kitchen dexlansoprazole (DEXILANT) 60 MG capsule Take 1 capsule (60 mg total) by mouth daily.  . hydrOXYzine (ATARAX/VISTARIL) 10 MG tablet TAKE 1 TABLET BY MOUTH THREE TIMES DAILY AS NEEDED  . PROAIR HFA 108 (90 Base) MCG/ACT inhaler INHALE 2 PUFFS BY MOUTH EVERY 6 HOURS AS NEEDED FOR WHEEZING AND FOR SHORTNESS OF BREATH  . sertraline (ZOLOFT) 100 MG tablet 150 mg.   . triamcinolone cream (KENALOG) 0.1 % Apply 1 application topically 2 (two) times daily.  Marland Kitchen venlafaxine XR (EFFEXOR-XR) 75 MG 24 hr capsule Take 75 mg by mouth daily.  . [DISCONTINUED] busPIRone (BUSPAR) 7.5 MG tablet Take 1 tablet (7.5 mg total) by mouth 2 (two) times daily.  . [DISCONTINUED] EQ ALLERGY RELIEF, CETIRIZINE, 10 MG tablet TAKE 1 TABLET BY MOUTH ONCE DAILY  . [DISCONTINUED] hydrOXYzine (ATARAX/VISTARIL) 25 MG tablet   . [DISCONTINUED] sertraline (ZOLOFT) 25 MG tablet TAKE 1 TABLET BY MOUTH ONCE DAILY   No facility-administered encounter medications on file as of 01/13/2019.     Activities of Daily Living In your present state of health, do you have any difficulty performing the following activities: 01/13/2019  Hearing? N   Comment declines hearing aids  Vision? N  Comment glasses  Difficulty concentrating or making decisions? Y  Comment concentrating  Walking or climbing stairs? N  Dressing or bathing? N  Doing errands, shopping? N  Preparing Food and eating ?  N  Using the Toilet? N  In the past six months, have you accidently leaked urine? Y  Comment wears liners for protection  Do you have problems with loss of bowel control? N  Managing your Medications? N  Managing your Finances? N  Housekeeping or managing your Housekeeping? N  Some recent data might be hidden    Patient Care Team: Duanne Limerick, MD as PCP - General (Family Medicine) Midge Minium, MD as Consulting Physician (Gastroenterology) Erin Fulling, MD as Consulting Physician (Pulmonary Disease)    Assessment:   This is a routine wellness examination for Marai.  Exercise Activities and Dietary recommendations Current Exercise Habits: Home exercise routine, Type of exercise: walking;calisthenics, Time (Minutes): 30, Frequency (Times/Week): 7, Weekly Exercise (Minutes/Week): 210, Intensity: Mild, Exercise limited by: None identified  Goals    . DIET - INCREASE WATER INTAKE     Recommend to drink at least 6-8 8oz glasses of water per day.       Fall Risk Fall Risk  01/13/2019 01/09/2018  Falls in the past year? 0 No  Number falls in past yr: 0 -  Injury with Fall? 0 -  Risk for fall due to : - History of fall(s);Impaired balance/gait;Impaired vision  Risk for fall due to: Comment - vertigo; wears eyeglasses   FALL RISK PREVENTION PERTAINING TO THE HOME:   Any stairs in or around the home? Yes  If so, do they handrails? Yes   Home free of loose throw rugs in walkways, pet beds, electrical cords, etc? Yes  Adequate lighting in your home to reduce risk of falls? Yes   ASSISTIVE DEVICES UTILIZED TO PREVENT FALLS:  Life alert? No  Use of a cane, walker or w/c? No  Grab bars in the bathroom? No  Shower chair or bench in  shower? No  Elevated toilet seat or a handicapped toilet? No   DME ORDERS:  DME order needed?  No   TIMED UP AND GO:  Was the test performed? Yes .  Length of time to ambulate 10 feet: 5 sec.   GAIT:  Appearance of gait: Gait stead-fast and without the use of an assistive device.   Education: Fall risk prevention has been discussed.  Intervention(s) required? No   Depression Screen PHQ 2/9 Scores 01/13/2019 01/09/2018 01/08/2018  PHQ - 2 Score PHQ- 9 Score Cognitive Function     6CIT Screen 01/13/2019 01/09/2018  What Year? 0 points 0 points  What month? 0 points 0 points  What time? 0 points 0 points  Count back from 20 0 points 0 points  Months in reverse 0 points 0 points  Repeat phrase 0 points 0 points  Total Score 0 0    Immunization History  Administered Date(s) Administered  . Influenza,inj,Quad PF,6+ Mos 08/30/2017, 08/20/2018  . Influenza-Unspecified 07/30/2017  . Tdap 03/30/2010    Qualifies for Shingles Vaccine? Yes  . Due for Shingrix. Education has been provided regarding the importance of this vaccine. Pt has been advised to call insurance company to determine out of pocket expense. Advised may also receive vaccine at local pharmacy or Health Dept. Verbalized acceptance and understanding.  Tdap: Up to date  Flu Vaccine: Up to date  Pneumococcal Vaccine: Due for Pneumococcal vaccine. Does the patient want to receive this vaccine today?  No . Education has been provided regarding the importance of this vaccine but still declined. Advised may receive this vaccine at  local pharmacy or Health Dept. Aware to provide a copy of the vaccination record if obtained from local pharmacy or Health Dept. Verbalized acceptance and understanding.   Screening Tests Health Maintenance  Topic Date Due  . PAP SMEAR-Modifier  10/30/2010  . MAMMOGRAM  10/30/2016  . TETANUS/TDAP  03/30/2020  . COLONOSCOPY  10/30/2021  . INFLUENZA VACCINE  Completed   . Hepatitis C Screening  Completed  . HIV Screening  Completed    Cancer Screenings:  Colorectal Screening: Completed approximately 2010 per Dr. Annabell SabalWohl's last note. Repeat every 10 years; Referral to GI placed today. Pt aware the office will call re: appt.  Mammogram: Completed approximately 2017. Repeat every year. Ordered today. Pt provided with contact information and advised to call to schedule appt.   Bone Density: due age 63  Lung Cancer Screening: (Low Dose CT Chest recommended if Age 71-80 years, 30 pack-year currently smoking OR have quit w/in 15years.) DOES qualify. Scheduled for repeat lung cancer screening next week.   Additional Screening:  Hepatitis C Screening: does qualify; Completed 12/15/17  Vision Screening: Recommended annual ophthalmology exams for early detection of glaucoma and other disorders of the eye. Is the patient up to date with their annual eye exam?  Yes  Who is the provider or what is the name of the office in which the pt attends annual eye exams? [facility]  Dental Screening: Recommended annual dental exams for proper oral hygiene  Community Resource Referral:  CRR required this visit?  No  pt did states financial difficulty with food and requested list of local food banks, provided in AVS     Plan:     I have personally reviewed and addressed the Medicare Annual Wellness questionnaire and have noted the following in the patient's chart:  A. Medical and social history B. Use of alcohol, tobacco or illicit drugs  C. Current medications and supplements D. Functional ability and status E.  Nutritional status F.  Physical activity G. Advance directives H. List of other physicians I.  Hospitalizations, surgeries, and ER visits in previous 12 months J.  Vitals K. Screenings such as hearing and vision if needed, cognitive and depression L. Referrals and appointments   In addition, I have reviewed and discussed with patient certain  preventive protocols, quality metrics, and best practice recommendations. A written personalized care plan for preventive services as well as general preventive health recommendations were provided to patient.   Signed,  Reather LittlerKasey Jostin Rue, LPN Nurse Health Advisor   Nurse Notes: pt due for office visit with Dr. Yetta BarreJones. Advised to schedule appt. She states she needs a refill for triamcinolone for itchy eczema spots. She would also like to discuss brittle finger nails, hot flashes/need for estrogen and requests fluticasone for runny nose and seasonal allergies. Pt did report that she has been concerned about 11 lb weight gain over the past year but she has cut down on her drinking to only a 6 pack of beer per week and increased exercise. She lives in an RV with her long time partner who she calls her husband.

## 2019-01-16 ENCOUNTER — Telehealth: Payer: Self-pay | Admitting: *Deleted

## 2019-01-16 NOTE — Telephone Encounter (Signed)
Patient notified/message left to notify patient that due to current restrictions lung screening appointments are cancelled and patient will be contacted regarding rescheduling.  

## 2019-01-23 ENCOUNTER — Inpatient Hospital Stay: Payer: Medicare Other | Admitting: Nurse Practitioner

## 2019-01-23 ENCOUNTER — Ambulatory Visit: Payer: Medicare Other

## 2019-01-23 ENCOUNTER — Ambulatory Visit: Payer: Medicare Other | Admitting: Family Medicine

## 2019-02-11 ENCOUNTER — Telehealth: Payer: Self-pay | Admitting: Gastroenterology

## 2019-02-11 ENCOUNTER — Other Ambulatory Visit: Payer: Self-pay

## 2019-02-11 DIAGNOSIS — Z1211 Encounter for screening for malignant neoplasm of colon: Secondary | ICD-10-CM

## 2019-02-11 NOTE — Telephone Encounter (Signed)
Gastroenterology Pre-Procedure Review  Request Date: Sarah Mcdonald  06/26/2019  [facility] Requesting Physician: Dr. Servando Snare  PATIENT REVIEW QUESTIONS: The patient responded to the following health history questions as indicated:    1. Are you having any GI issues? no 2. Do you have a personal history of Polyps? yes (Not sure) 3. Do you have a family history of Colon Cancer or Polyps? no 4. Diabetes Mellitus? no 5. Joint replacements in the past 12 months?no 6. Major health problems in the past 3 months?no 7. Any artificial heart valves, MVP, or defibrillator?no    MEDICATIONS & ALLERGIES:    Patient reports the following regarding taking any anticoagulation/antiplatelet therapy:   Plavix, Coumadin, Eliquis, Xarelto, Lovenox, Pradaxa, Brilinta, or Effient? no Aspirin? yes (81 mg)  Patient confirms/reports the following medications:  Current Outpatient Medications  Medication Sig Dispense Refill  . busPIRone (BUSPAR) 15 MG tablet Take 15 mg by mouth 2 (two) times daily.    . cetirizine (EQ ALLERGY RELIEF, CETIRIZINE,) 10 MG tablet Take 1 tablet (10 mg total) by mouth daily. 30 tablet 0  . clonazePAM (KLONOPIN) 1 MG tablet Take 1 mg by mouth 2 (two) times daily.    . cyclobenzaprine (FLEXERIL) 10 MG tablet Take 1 tablet (10 mg total) by mouth 3 (three) times daily as needed for muscle spasms. 90 tablet 1  . dexlansoprazole (DEXILANT) 60 MG capsule Take 1 capsule (60 mg total) by mouth daily. 30 capsule 0  . hydrOXYzine (ATARAX/VISTARIL) 10 MG tablet TAKE 1 TABLET BY MOUTH THREE TIMES DAILY AS NEEDED 30 tablet 0  . PROAIR HFA 108 (90 Base) MCG/ACT inhaler INHALE 2 PUFFS BY MOUTH EVERY 6 HOURS AS NEEDED FOR WHEEZING AND FOR SHORTNESS OF BREATH 2 each 0  . sertraline (ZOLOFT) 100 MG tablet 150 mg.   0  . triamcinolone cream (KENALOG) 0.1 % Apply 1 application topically 2 (two) times daily. 453.6 g 1  . venlafaxine XR (EFFEXOR-XR) 75 MG 24 hr capsule Take 75 mg by mouth daily.     No current  facility-administered medications for this visit.     Patient confirms/reports the following allergies:  Allergies  Allergen Reactions  . Seroquel [Quetiapine Fumarate] Other (See Comments)    Altered mental status  . Amitriptyline     No orders of the defined types were placed in this encounter.   AUTHORIZATION INFORMATION Primary Insurance: 1D#: Group #:  Secondary Insurance: 1D#: Group #:  SCHEDULE INFORMATION: Date:  Time: Location:

## 2019-02-20 DIAGNOSIS — L309 Dermatitis, unspecified: Secondary | ICD-10-CM | POA: Diagnosis not present

## 2019-02-20 DIAGNOSIS — F411 Generalized anxiety disorder: Secondary | ICD-10-CM | POA: Diagnosis not present

## 2019-02-20 DIAGNOSIS — F332 Major depressive disorder, recurrent severe without psychotic features: Secondary | ICD-10-CM | POA: Diagnosis not present

## 2019-02-20 DIAGNOSIS — F4002 Agoraphobia without panic disorder: Secondary | ICD-10-CM | POA: Diagnosis not present

## 2019-02-25 ENCOUNTER — Other Ambulatory Visit: Payer: Self-pay

## 2019-02-25 ENCOUNTER — Ambulatory Visit (INDEPENDENT_AMBULATORY_CARE_PROVIDER_SITE_OTHER): Payer: Medicare Other | Admitting: Family Medicine

## 2019-02-25 ENCOUNTER — Encounter: Payer: Self-pay | Admitting: Family Medicine

## 2019-02-25 VITALS — BP 130/70 | HR 72 | Ht 62.0 in | Wt 135.0 lb

## 2019-02-25 DIAGNOSIS — L2082 Flexural eczema: Secondary | ICD-10-CM | POA: Diagnosis not present

## 2019-02-25 DIAGNOSIS — N939 Abnormal uterine and vaginal bleeding, unspecified: Secondary | ICD-10-CM

## 2019-02-25 DIAGNOSIS — K219 Gastro-esophageal reflux disease without esophagitis: Secondary | ICD-10-CM

## 2019-02-25 DIAGNOSIS — F32A Depression, unspecified: Secondary | ICD-10-CM

## 2019-02-25 DIAGNOSIS — F329 Major depressive disorder, single episode, unspecified: Secondary | ICD-10-CM

## 2019-02-25 LAB — HEMOCCULT GUIAC POC 1CARD (OFFICE): Fecal Occult Blood, POC: NEGATIVE

## 2019-02-25 LAB — POCT URINALYSIS DIPSTICK
Bilirubin, UA: NEGATIVE
Blood, UA: NEGATIVE
Glucose, UA: NEGATIVE
Ketones, UA: NEGATIVE
Leukocytes, UA: NEGATIVE
Nitrite, UA: NEGATIVE
Protein, UA: NEGATIVE
Spec Grav, UA: <=1.005 — AB (ref 1.010–1.025)
Urobilinogen, UA: 0.2 E.U./dL
pH, UA: 5 (ref 5.0–8.0)

## 2019-02-25 MED ORDER — DEXLANSOPRAZOLE 60 MG PO CPDR: 1.0000 | DELAYED_RELEASE_CAPSULE | Freq: Every day | ORAL | 1 refills | Status: DC

## 2019-02-25 MED ORDER — TRIAMCINOLONE ACETONIDE 0.1 % EX CREA: 1.0000 "application " | TOPICAL_CREAM | Freq: Two times a day (BID) | CUTANEOUS | 1 refills | Status: DC

## 2019-02-25 NOTE — Progress Notes (Signed)
Date:  02/25/2019   Name:  Sarah Mcdonald   DOB:  04/01/1956   MRN:  [id number]   Chief Complaint: Menopause (hot flashes) and Vaginal Bleeding (has not had a period in 33 yrs. Now seeing red/ yellow or orangish discharge from vagina)  Vaginal Bleeding  The patient's primary symptoms include vaginal discharge. The patient's pertinent negatives include no pelvic pain. Primary symptoms comment: strands of blood from vaginal/or bladder. This is a new problem. The current episode started 1 to 4 weeks ago (2 weeks ago). The problem occurs intermittently. The problem has been waxing and waning. The patient is experiencing no pain. Associated symptoms include diarrhea, discolored urine, hematuria, painful intercourse and urgency. Pertinent negatives include no abdominal pain, anorexia, back pain, chills, constipation, dysuria, fever, flank pain, frequency, headaches, joint pain, joint swelling, nausea, rash, sore throat or vomiting. Associated symptoms comments: Color bright yellow/nocturia/incontinence. The vaginal discharge was brown (uncertain if vaginal/tan/orange with strips of red). The vaginal bleeding is spotting (orange color). She has not been passing clots (srands). She has not been passing tissue. She has tried nothing for the symptoms. She is sexually active.    Review of Systems  Constitutional: Negative.  Negative for chills, fatigue, fever and unexpected weight change.  HENT: Negative for congestion, ear discharge, ear pain, rhinorrhea, sinus pressure, sneezing and sore throat.   Eyes: Negative for photophobia, pain, discharge, redness and itching.  Respiratory: Negative for cough, shortness of breath, wheezing and stridor.   Gastrointestinal: Positive for diarrhea. Negative for abdominal pain, anorexia, blood in stool, constipation, nausea and vomiting.  Endocrine: Negative for cold intolerance, heat intolerance, polydipsia, polyphagia and polyuria.  Genitourinary: Positive for  hematuria, urgency, vaginal bleeding and vaginal discharge. Negative for dysuria, flank pain, frequency, menstrual problem and pelvic pain.  Musculoskeletal: Negative for arthralgias, back pain, joint pain and myalgias.  Skin: Negative for rash.  Allergic/Immunologic: Negative for environmental allergies and food allergies.  Neurological: Negative for dizziness, weakness, light-headedness, numbness and headaches.  Hematological: Negative for adenopathy. Does not bruise/bleed easily.  Psychiatric/Behavioral: Negative for dysphoric mood. The patient is not nervous/anxious.     Patient Active Problem List   Diagnosis Date Noted  . Abrasion of back 01/08/2018    Allergies  Allergen Reactions  . Seroquel [Quetiapine Fumarate] Other (See Comments)    Altered mental status  . Amitriptyline     Past Surgical History:  Procedure Laterality Date  . ABDOMINAL HYSTERECTOMY    . BILATERAL CARPAL TUNNEL RELEASE    . CYST REMOVAL HAND    . ELBOW SURGERY    . KNEE SURGERY    . LIVER BIOPSY    . MOUTH SURGERY    . NOSE SURGERY     deviated septal repair  . OVARIAN CYST REMOVAL    . TONSILLECTOMY    . TUBAL LIGATION      Social History   Tobacco Use  . Smoking status: Former Smoker    Packs/day: 1.00    Years: 41.00    Pack years: 41.00    Types: Cigarettes    Last attempt to quit: 09/14/2014    Years since quitting: 4.4  . Smokeless tobacco: Never Used  . Tobacco comment: smoking cessation materials not required  Substance Use Topics  . Alcohol use: Yes    Alcohol/week: 6.0 standard drinks    Types: 6 Cans of beer per week    Comment: 6 pack per week  . Drug use: No     Medication  list has been reviewed and updated.  Current Meds  Medication Sig  . busPIRone (BUSPAR) 15 MG tablet Take 15 mg by mouth 2 (two) times daily.  . cetirizine (EQ ALLERGY RELIEF, CETIRIZINE,) 10 MG tablet Take 1 tablet (10 mg total) by mouth daily.  . clonazePAM (KLONOPIN) 1 MG tablet Take 1 mg  by mouth 2 (two) times daily. Psych  . cyclobenzaprine (FLEXERIL) 10 MG tablet Take 1 tablet (10 mg total) by mouth 3 (three) times daily as needed for muscle spasms.  Marland Kitchen dexlansoprazole (DEXILANT) 60 MG capsule Take 1 capsule (60 mg total) by mouth daily.  . hydrOXYzine (ATARAX/VISTARIL) 10 MG tablet TAKE 1 TABLET BY MOUTH THREE TIMES DAILY AS NEEDED (Patient taking differently: psych)  . PROAIR HFA 108 (90 Base) MCG/ACT inhaler INHALE 2 PUFFS BY MOUTH EVERY 6 HOURS AS NEEDED FOR WHEEZING AND FOR SHORTNESS OF BREATH  . triamcinolone cream (KENALOG) 0.1 % Apply 1 application topically 2 (two) times daily.  Marland Kitchen venlafaxine XR (EFFEXOR-XR) 75 MG 24 hr capsule Take 75 mg by mouth daily. psych  . [DISCONTINUED] sertraline (ZOLOFT) 100 MG tablet 150 mg. psych    PHQ 2/9 Scores 01/13/2019 01/09/2018 01/08/2018  PHQ - 2 Score 2 2 3   PHQ- 9 Score 11 16 12     BP Readings from Last 3 Encounters:  02/25/19 130/70  01/13/19 110/70  08/20/18 122/80    Physical Exam Vitals signs and nursing note reviewed. Exam conducted with a chaperone present.  Constitutional:      General: She is not in acute distress.    Appearance: She is not diaphoretic.  HENT:     Head: Normocephalic and atraumatic.     Right Ear: Tympanic membrane, ear canal and external ear normal.     Left Ear: Tympanic membrane, ear canal and external ear normal.     Nose: Nose normal.  Eyes:     General:        Right eye: No discharge.        Left eye: No discharge.     Conjunctiva/sclera: Conjunctivae normal.     Pupils: Pupils are equal, round, and reactive to light.  Neck:     Musculoskeletal: Normal range of motion and neck supple.     Thyroid: No thyromegaly.     Vascular: No JVD.  Cardiovascular:     Rate and Rhythm: Normal rate and regular rhythm.     Pulses: Normal pulses.     Heart sounds: Normal heart sounds. No murmur. No friction rub. No gallop.   Pulmonary:     Effort: Pulmonary effort is normal.     Breath sounds:  Normal breath sounds.  Abdominal:     General: Bowel sounds are normal.     Palpations: Abdomen is soft. There is no mass.     Tenderness: There is no abdominal tenderness. There is no guarding.  Genitourinary:    General: Normal vulva.     Exam position: Lithotomy position.     Pubic Area: No rash.      Labia:        Right: No rash, tenderness, lesion or injury.        Left: No rash, tenderness, lesion or injury.      Vagina: Lesions present.     Adnexa: Right adnexa normal and left adnexa normal.     Rectum: Normal. Guaiac result negative.     Comments: Excoriation/mucosal erythematous macular floor of vagina Musculoskeletal: Normal range of motion.  Lymphadenopathy:  Cervical: No cervical adenopathy.  Skin:    General: Skin is warm and dry.     Coloration: Skin is not pale.  Neurological:     Mental Status: She is alert.     Deep Tendon Reflexes: Reflexes are normal and symmetric.     Wt Readings from Last 3 Encounters:  02/25/19 135 lb (61.2 kg)  01/13/19 132 lb (59.9 kg)  08/20/18 129 lb (58.5 kg)    BP 130/70   Pulse 72   Ht  (1.575 m)   Wt 135 lb (61.2 kg)   BMI 24.69 kg/m   Assessment and Plan: 1. Depression, unspecified depression type Patient is followed for pression by psychiatry.  Currently is on medications for this and we have encouraged her to continue this relationship and to continue prescriptions at the physicians discretion - triamcinolone cream (KENALOG) 0.1 %; Apply 1 application topically 2 (two) times daily.  Dispense: 453.6 g; Refill: 1 - dexlansoprazole (DEXILANT) 60 MG capsule; Take 1 capsule (60 mg total) by mouth daily.  Dispense: 90 capsule; Refill: 1  2. Flexural eczema Chronic.  Persistent.  Refill triamcinolone cream 0.1% to be applied twice a day.  3. Gastroesophageal reflux disease, esophagitis presence not specified Patient has recurrent which is alleviated by Dexilant 60 mg daily we will refill this at this time. -  dexlansoprazole (DEXILANT) 60 MG capsule; Take 1 capsule (60 mg total) by mouth daily.  Dispense: 90 capsule; Refill: 1  4. Vaginal bleeding, abnormal Patient has noted some strands of blood in the toilet which she does not think is urinary and urinalysis today showed no urine on evaluation.  Patient has a history of a hysterectomy and has noticed this after intercourse on some occasions.  Patient is not having any vaginal discharge nor vaginal discomfort only some dyspareunia due to lubrication.  Examination today only notes a mild excoriated area in the floor of the vagina which may be abnormal surface lesion.  Will refer to GYN for evaluation.  No palpable mass was noted on bimanual. - Ambulatory referral to Obstetrics / Gynecology - POCT occult blood stool - POCT urinalysis dipstick  5.  Patient was encouraged to return for recheck of a persistence of abdominal pain since she seen other physicians for in the past and we will provide initiation of this.  Patient is also waiting to see if she has means of resuming her Medicaid

## 2019-02-25 NOTE — Patient Instructions (Signed)
Black Cohosh,  Cimicifuga racemosa oral dosage forms  What is this medicine? BLACK COHOSH (blak KOH hosh) or Cimicifuga racemosa is a dietary supplement. It is promoted to relieve symptoms of menopause, such as hot flashes. The FDA has not approved this supplement for any medical use. This supplement may be used for other purposes; ask your health care provider or pharmacist if you have questions. This medicine may be used for other purposes; ask your health care provider or pharmacist if you have questions. What should I tell my health care provider before I take this medicine? They need to know if you have any of these conditions: -breast cancer -cervical, ovarian or uterine cancer -high blood pressure -infertility -liver disease -menstrual changes or irregular periods -unusual vaginal or uterine bleeding -an unusual or allergic reaction to black cohosh, soybeans, tartrazine dye (yellow dye number 5), other medicines, foods, dyes, or preservatives -pregnant or trying to get pregnant -breast-feeding How should I use this medicine? Take this herb by mouth with a glass of water. Follow the directions on the package labeling, or talk to your health care professional. Do not use for longer than 6 months without the advice of a health care professional. Do not use if you are pregnant or breast-feeding. Talk to your obstetrician-gynecologist or certified nurse-midwife. This herb is not for use in children under the age of 18 years. Overdosage: If you think you have taken too much of this medicine contact a poison control center or emergency room at once. NOTE: This medicine is only for you. Do not share this medicine with others. What if I miss a dose? If you miss a dose, take it as soon as you can. If it is almost time for your next dose, take only that dose. Do not take double or extra doses. What may interact with this medicine? -atorvastatin -cisplatin -fertility treatments This list may  not describe all possible interactions. Give your health care provider a list of all the medicines, herbs, non-prescription drugs, or dietary supplements you use. Also tell them if you smoke, drink alcohol, or use illegal drugs. Some items may interact with your medicine. What should I watch for while using this medicine? Since this herb is derived from a plant, allergic reactions are possible. Stop using this herb if you develop a rash. You may need to see your health care professional, or inform them that this occurred. Report any unusual side effects promptly. If you are taking this herb for menstrual or menopausal symptoms, visit your doctor or health care professional for regular checks on your progress. You should have a complete check-up every 6 months. You will need a regular breast and pelvic exam while on this therapy. Follow the advice of your doctor or health care professional. Women should inform their doctor if they wish to become pregnant or think they might be pregnant. If you have any reason to think you are pregnant, stop taking this herb at once and contact your doctor or health care professional. Herbal or dietary supplements are not regulated like medicines. Rigid quality control standards are not required for dietary supplements. The purity and strength of these products can vary. The safety and effect of this dietary supplement for a certain disease or illness is not well known. This product is not intended to diagnose, treat, cure or prevent any disease. The Food and Drug Administration suggests the following to help consumers protect themselves: -Always read product labels and follow directions. -Natural does not mean a   product is safe for humans to take. -Look for products that include USP after the ingredient name. This means that the manufacturer followed the standards of the U.S. Pharmacopoeia. -Supplements made or sold by a nationally known food or drug company are more likely  to be made under tight controls. You can write to the company for more information about how the product was made. What side effects may I notice from receiving this medicine? Side effects that you should report to your doctor or health care professional as soon as possible: -allergic reactions like skin rash, itching or hives, swelling of the face, lips, or tongue -breathing problems -dizziness -palpitations -signs and symptoms of liver injury like dark yellow or brown urine; general ill feeling or flu-like symptoms; light-colored stools; loss of appetite; nausea; right upper belly pain; unusually weak or tired; yellowing of the eyes or skin -unusual vaginal bleeding Side effects that usually do not require medical attention (report to your doctor or health care professional if they continue or are bothersome): -breast tenderness -headache -nausea -upset stomach This list may not describe all possible side effects. Call your doctor for medical advice about side effects. You may report side effects to FDA at 1-800-FDA-1088. Where should I keep my medicine? Keep out of the reach of children. Store at room temperature between 15 and 30 degrees C (59 and 86 degrees C). Throw away any unused herb after the expiration date. NOTE: This sheet is a summary. It may not cover all possible information. If you have questions about this medicine, talk to your doctor, pharmacist, or health care provider.  2019 Elsevier/Gold Standard (2016-04-26 14:35:09)  

## 2019-03-05 ENCOUNTER — Encounter: Payer: Medicare Other | Admitting: Obstetrics and Gynecology

## 2019-03-10 ENCOUNTER — Other Ambulatory Visit: Payer: Self-pay

## 2019-03-12 ENCOUNTER — Telehealth: Payer: Self-pay | Admitting: *Deleted

## 2019-03-12 NOTE — Telephone Encounter (Signed)
Patient is rescheduled for lung screening scan, see prior notes for eligibility information.

## 2019-03-13 ENCOUNTER — Encounter: Payer: Self-pay | Admitting: Obstetrics and Gynecology

## 2019-03-13 ENCOUNTER — Ambulatory Visit (INDEPENDENT_AMBULATORY_CARE_PROVIDER_SITE_OTHER): Payer: Medicare Other | Admitting: Obstetrics and Gynecology

## 2019-03-13 ENCOUNTER — Other Ambulatory Visit: Payer: Self-pay

## 2019-03-13 VITALS — BP 128/88 | HR 82 | Ht 61.0 in | Wt 133.0 lb

## 2019-03-13 DIAGNOSIS — N952 Postmenopausal atrophic vaginitis: Secondary | ICD-10-CM | POA: Diagnosis not present

## 2019-03-13 MED ORDER — ESTROGENS, CONJUGATED 0.625 MG/GM VA CREA: 1.0000 | TOPICAL_CREAM | VAGINAL | 3 refills | Status: DC

## 2019-03-13 NOTE — Patient Instructions (Signed)
Xenical or Ali (Orlistat)  

## 2019-03-13 NOTE — Progress Notes (Signed)
Gynecology H&P  Chief Complaint:  Chief Complaint  Patient presents with  . Vaginal Bleeding    Pt has a scratch inside vagina/vaginal spotting    History of Present Illness: Patient is a 63 y.o. female presents evaluation of postmenopausal bleeding at the request of [facility]. The patient states she has had a single episode(s) of bleeding in the past 2 week(s).  The most recent episode occurred  2  week(s) ago.  The bleeding has been limited to spotting . She describes the blood as pink to dark brown in appearance.  She has had no additional complaints. She deniestrauma or other inciting event, bloating, cramping, early satiety and abdominal pain. She does not have a history of abnormal pap smears.  She is status post prior abdominal hysterectomy for ovarian cyst.  The patient is currently sexually active with no postcoital bleeding noted , she does report some dyspareunia. There are no other aggravating factors reported. There are no alleviating factors reported. The patient's past medical history is noncontributory.   She has nothad prior work up for postmenopausal bleeding.  Review of Systems: 10 point review of systems negative unless otherwise noted in HPI  Past Medical History:  Past Medical History:  Diagnosis Date  . ADHD   . ADHD   . Allergy   . Anxiety   . Asthma   . Back pain   . COPD (chronic obstructive pulmonary disease) (HCC)   . Depression   . Dyspnea   . Eczema   . Fibromyalgia   . GERD (gastroesophageal reflux disease)   . History of hepatitis C   . Vertigo     Past Surgical History:  Past Surgical History:  Procedure Laterality Date  . ABDOMINAL HYSTERECTOMY    . BILATERAL CARPAL TUNNEL RELEASE    . CYST REMOVAL HAND    . ELBOW SURGERY    . KNEE SURGERY    . LIVER BIOPSY    . MOUTH SURGERY    . NOSE SURGERY     deviated septal repair  . OVARIAN CYST REMOVAL    . TONSILLECTOMY    . TUBAL LIGATION      Family History:   Family History  Problem Relation Age of Onset  . Heart failure Mother   . Emphysema Mother   . Obesity Mother   . Healthy Father   . Cancer Sister   . Cancer Brother   . Cancer Sister        throat  . COPD Brother   . Hepatitis C Brother   . Pneumonia Sister     Social History:  Social History   Socioeconomic History  . Marital status: Divorced    Spouse name: Not on file  . Number of children: 3  . Years of education: some college  . Highest education level: 12th grade  Occupational History  . Occupation: Disabled  Social Needs  . Financial resource strain: Not very hard  . Food insecurity:    Worry: Never true    Inability: Never true  . Transportation needs:    Medical: No    Non-medical: No  Tobacco Use  . Smoking status: Former Smoker    Packs/day: 1.00    Years: 41.00    Pack years: 41.00    Types: Cigarettes    Last attempt to quit: 09/14/2014    Years since quitting: 4.4  . Smokeless tobacco: Never Used  . Tobacco comment: smoking cessation materials not  required  Substance and Sexual Activity  . Alcohol use: Yes    Alcohol/week: 6.0 standard drinks    Types: 6 Cans of beer per week    Comment: 6 pack per week  . Drug use: No  . Sexual activity: Yes  Lifestyle  . Physical activity:    Days per week: 7 days    Minutes per session: 30 min  . Stress: Only a little  Relationships  . Social connections:    Talks on phone: More than three times a week    Gets together: Three times a week    Attends religious service: Never    Active member of club or organization: No    Attends meetings of clubs or organizations: Never    Relationship status: Divorced  . Intimate partner violence:    Fear of current or ex partner: No    Emotionally abused: No    Physically abused: No    Forced sexual activity: No  Other Topics Concern  . Not on file  Social History Narrative  . Not on file    Allergies:  Allergies  Allergen Reactions  . Seroquel  [Quetiapine Fumarate] Other (See Comments)    Altered mental status  . Amitriptyline     Medications: Prior to Admission medications   Medication Sig Start Date End Date Taking? Authorizing Provider  busPIRone (BUSPAR) 15 MG tablet Take 15 mg by mouth 2 (two) times daily. 11/28/18  Yes [provider]  cetirizine (EQ ALLERGY RELIEF, CETIRIZINE,) 10 MG tablet Take 1 tablet (10 mg total) by mouth daily. 11/14/18  Yes Duanne Limerick, MD  clonazePAM (KLONOPIN) 1 MG tablet Take 1 mg by mouth 2 (two) times daily. Psych   Yes [provider]  cyclobenzaprine (FLEXERIL) 10 MG tablet Take 1 tablet (10 mg total) by mouth 3 (three) times daily as needed for muscle spasms. 01/08/18  Yes Duanne Limerick, MD  dexlansoprazole (DEXILANT) 60 MG capsule Take 1 capsule (60 mg total) by mouth daily. 02/25/19  Yes Duanne Limerick, MD  hydrOXYzine (ATARAX/VISTARIL) 10 MG tablet TAKE 1 TABLET BY MOUTH THREE TIMES DAILY AS NEEDED Patient taking differently: psych 09/19/18  Yes Duanne Limerick, MD  PROAIR HFA 108 (90 Base) MCG/ACT inhaler INHALE 2 PUFFS BY MOUTH EVERY 6 HOURS AS NEEDED FOR WHEEZING AND FOR SHORTNESS OF BREATH 09/19/18  Yes Duanne Limerick, MD  triamcinolone cream (KENALOG) 0.1 % Apply 1 application topically 2 (two) times daily. 02/25/19  Yes Duanne Limerick, MD  venlafaxine XR (EFFEXOR-XR) 75 MG 24 hr capsule Take 75 mg by mouth daily. psych 11/28/18  Yes [provider]  conjugated estrogens (PREMARIN) vaginal cream Place 1 Applicatorful vaginally 2 (two) times a week. 03/13/19   Vena Austria, MD    Physical Exam Vitals: Blood pressure 128/88, pulse 82, height  (1.549 m), weight 133 lb (60.3 kg).  General: NAD, well nourished, appears stated age HEENT: normocephalic, anicteric Pulmonary: No increased work of breathing Abdomen: Soft, non-tender, non-distended.   Genitourinary:  External: Normal external female genitalia with some atrophic changes.  Normal urethral  meatus, normal Bartholin's and Skene's glands.    Vagina: Normal vaginal mucosa, no evidence of prolapse.  Loss of rugae as expected for age.    Cervix: The vaginal cuff is intact and well healed, no defects or excoriations noted  Uterus: surgically absent  Adnexa: surgically absent  Rectal: deferred Extremities: no edema, erythema, or tenderness Neurologic: Grossly intact Psychiatric: mood appropriate,  affect full  Assessment: 63 y.o. with an episode of PMB, evidence of vulvovaginal atrophy  Plan: Problem List Items Addressed This Visit    None    Visit Diagnoses    Vaginal atrophy    -  Primary      1) We discussed that menopause is a clinical diagnosis made after 12 months of amenorrhea.  The average age of menopause in the  [place] population is 58 but there may be significant variation.  Any bleeding that happens after a 12 month period of amenorrhea warrants further work.  Possible etiologies of postmenopausal bleeding were discussed with the patient today.  These may range from benign etiologies such as urethral prolapse and atrophy, to indeterminate lesions such as submucosal fibroids or polyps which would require resection to accurately evaluate. The role of unopposed estrogen in the development of  dndometrial hyperplasia or carcinoma is discussed.  Given prior hysterectomy endometrial or cervical etiologies are ruled out.    2) Will trial on vaginal premarin   3) A total of 15 minutes were spent in face-to-face contact with the patient during this encounter with over half of that time devoted to counseling and coordination of care.  4)  No follow-ups on file.   Vena Austria, MD, [facility], [facility] 03/13/2019, 7:02 PM

## 2019-03-21 ENCOUNTER — Other Ambulatory Visit: Payer: Self-pay | Admitting: Family Medicine

## 2019-03-21 ENCOUNTER — Other Ambulatory Visit: Payer: Self-pay

## 2019-03-21 ENCOUNTER — Ambulatory Visit
Admission: RE | Admit: 2019-03-21 | Discharge: 2019-03-21 | Disposition: A | Discharge status: Home / Self Care | Payer: Medicare Other | Source: Ambulatory Visit | Attending: Nurse Practitioner | Admitting: Nurse Practitioner

## 2019-03-21 ENCOUNTER — Inpatient Hospital Stay: Payer: Medicare Other | Attending: Nurse Practitioner | Admitting: Nurse Practitioner

## 2019-03-21 DIAGNOSIS — Z87891 Personal history of nicotine dependence: Secondary | ICD-10-CM

## 2019-03-21 DIAGNOSIS — Z122 Encounter for screening for malignant neoplasm of respiratory organs: Secondary | ICD-10-CM | POA: Diagnosis not present

## 2019-03-21 DIAGNOSIS — L2082 Flexural eczema: Secondary | ICD-10-CM

## 2019-03-21 NOTE — Progress Notes (Signed)
Virtual Visit via Telemedicine Note   I connected with Lalitha Ilyas on 03/21/19 EST at 3:45 PM EST by video enabled telemedicine visit and verified that I am speaking with the correct person using two identifiers.   I discussed the limitations, risks, security and privacy concerns of performing an evaluation and management service by telemedicine and the availability of in-person appointments. I also discussed with the patient that there may be a patient responsible charge related to this service. The patient expressed understanding and agreed to proceed.   Other persons participating in the visit and their role in the encounter: Burgess Estelle, RN  Patient's location: clinic  Provider's location: home  Chief Complaint: Low Dose CT Screening  Patient agreed to evaluation by telephone/telemedicine to discuss shared decision making for consideration of low dose CT lung cancer screening.    In accordance with CMS guidelines, patient has met eligibility criteria including age, absence of signs or symptoms of lung cancer.  Social History   Tobacco Use  . Smoking status: Former Smoker    Packs/day: 1.00    Years: 41.00    Pack years: 41.00    Types: Cigarettes    Last attempt to quit: 09/14/2014    Years since quitting: 4.5  . Smokeless tobacco: Never Used  . Tobacco comment: smoking cessation materials not required  Substance Use Topics  . Alcohol use: Yes    Alcohol/week: 6.0 standard drinks    Types: 6 Cans of beer per week    Comment: 6 pack per week     A shared decision-making session was conducted prior to the performance of CT scan. This includes one or more decision aids, includes benefits and harms of screening, follow-up diagnostic testing, over-diagnosis, false positive rate, and total radiation exposure.   Counseling on the importance of adherence to annual lung cancer LDCT screening, impact of co-morbidities, and ability or willingness to undergo diagnosis and treatment  is imperative for compliance of the program.   Counseling on the importance of continued smoking cessation for former smokers; the importance of smoking cessation for current smokers, and information about tobacco cessation interventions have been given to patient including [place] and [facility] quit [place] programs.   Written order for lung cancer screening with LDCT has been given to the patient and any and all questions have been answered to the best of my abilities.    Yearly follow up will be coordinated by Burgess Estelle, Thoracic Navigator.  I discussed the assessment and treatment plan with the patient. The patient was provided an opportunity to ask questions and all were answered. The patient agreed with the plan and demonstrated an understanding of the instructions.   The patient was advised to call back or seek an in-person evaluation if the symptoms worsen or if the condition fails to improve as anticipated.   I provided 15 minutes of face-to-face video visit time during this encounter, and > 50% was spent counseling as documented under my assessment & plan.   Beckey Rutter, DNP, AGNP-C [place] at [facility] [phone] (work cell) [phone] (office)

## 2019-03-25 ENCOUNTER — Encounter: Payer: Self-pay | Admitting: *Deleted

## 2019-03-26 ENCOUNTER — Ambulatory Visit: Payer: Medicare Other

## 2019-03-29 ENCOUNTER — Other Ambulatory Visit: Payer: Self-pay | Admitting: Family Medicine

## 2019-03-30 ENCOUNTER — Other Ambulatory Visit: Payer: Self-pay | Admitting: Family Medicine

## 2019-03-30 DIAGNOSIS — L2082 Flexural eczema: Secondary | ICD-10-CM

## 2019-04-30 ENCOUNTER — Other Ambulatory Visit: Payer: Self-pay | Admitting: Family Medicine

## 2019-04-30 DIAGNOSIS — F329 Major depressive disorder, single episode, unspecified: Secondary | ICD-10-CM

## 2019-04-30 DIAGNOSIS — F32A Depression, unspecified: Secondary | ICD-10-CM

## 2019-06-17 ENCOUNTER — Telehealth: Payer: Self-pay | Admitting: Gastroenterology

## 2019-06-17 NOTE — Telephone Encounter (Signed)
Pt left vm she has a procedure on 06/26/19 and she needs to cancel because she has not heard back from [facility] .Marland Kitchen

## 2019-06-18 NOTE — Telephone Encounter (Signed)
Contacted pt to confirm colonoscopy cancellation. Pt waiting on Medicaid approval.

## 2019-06-24 IMAGING — CT CT CHEST LUNG CANCER SCREENING LOW DOSE
2 of 5 series · 15 of 40 positions shown, 18 images · non-contrast
Comparison: Chest CT 01/01/2018.

CLINICAL DATA: 62-year-old female former smoker (quit 5 years ago)
with 41 pack-year history of smoking. Lung cancer screening
examination.

EXAM:
CT CHEST WITHOUT CONTRAST LOW-DOSE FOR LUNG CANCER SCREENING
TECHNIQUE: Multidetector CT imaging of the chest was performed following the
standard protocol without IV contrast.

[Series 3: lung · axial · 0.61mm/px · z∈[-1236,-936]mm · 12 of 331 slices shown, 15 images (1 of 2)]
[im 16/331  mediastinal]
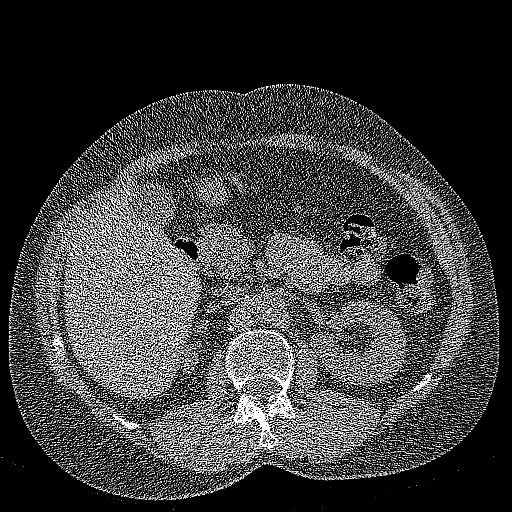
[im 16/331  lung]
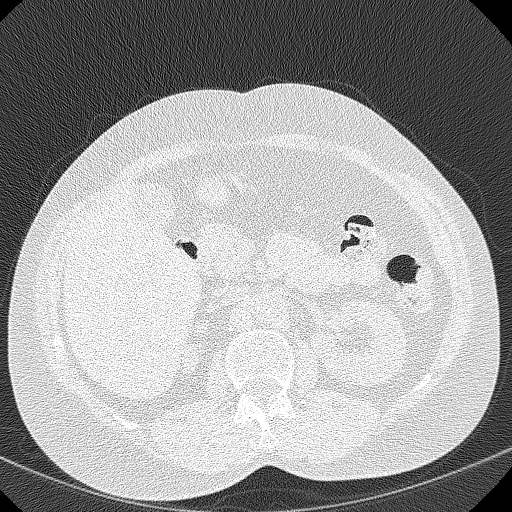
[im 46/331  lung]
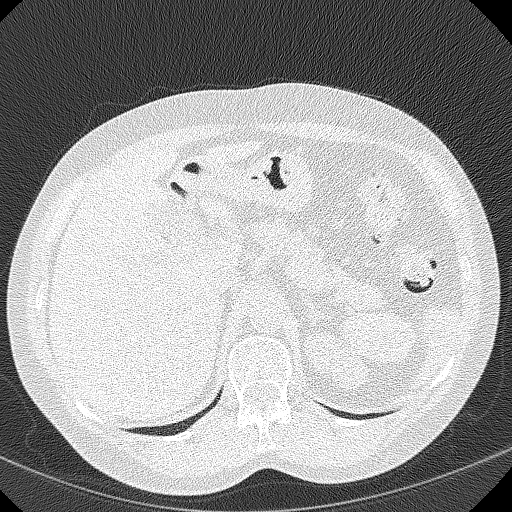
[im 76/331  lung]
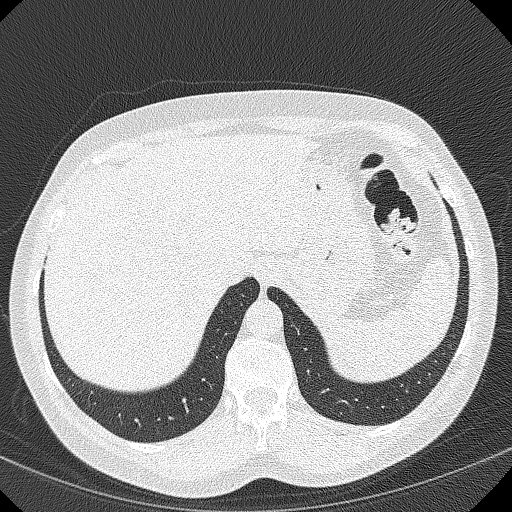
[im 106/331  lung]
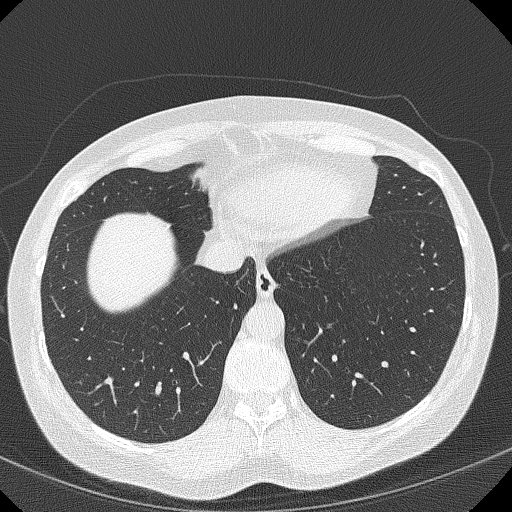
[im 121/331  mediastinal]
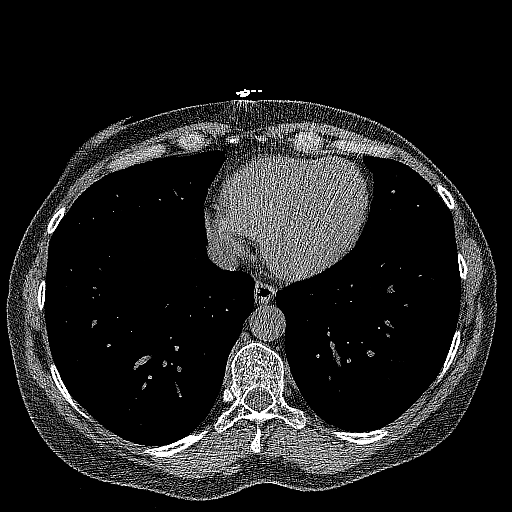
[im 121/331  lung]
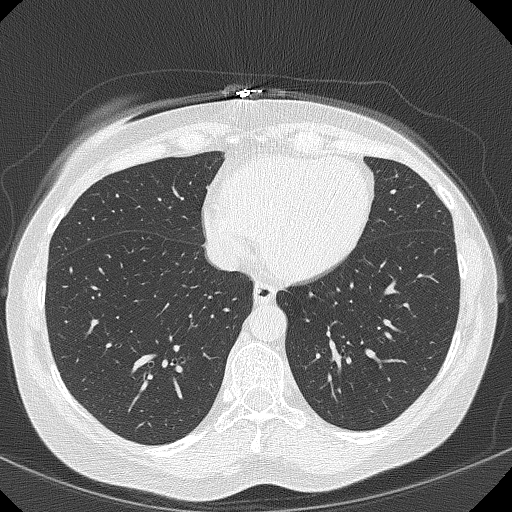
[im 151/331  lung]
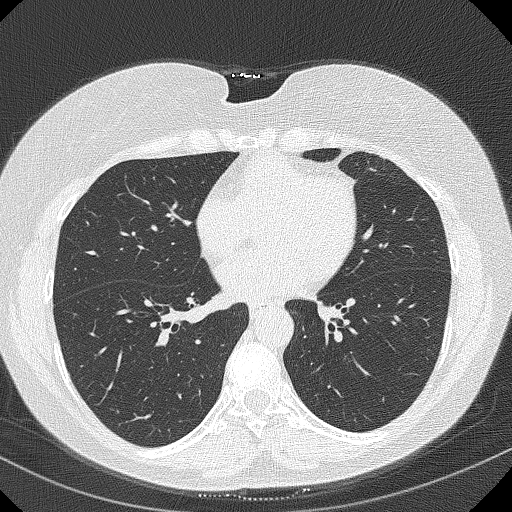
[im 181/331  lung]
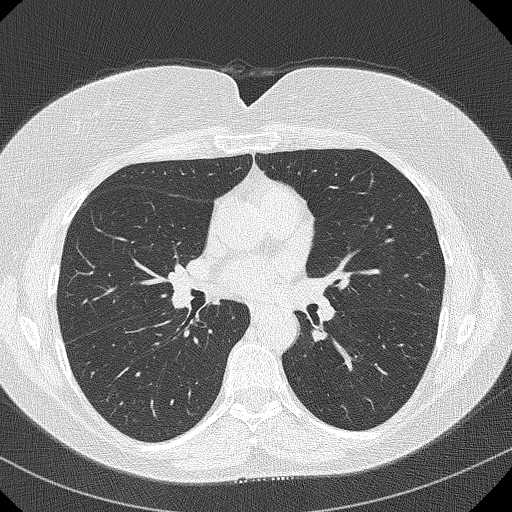
[im 211/331  lung]
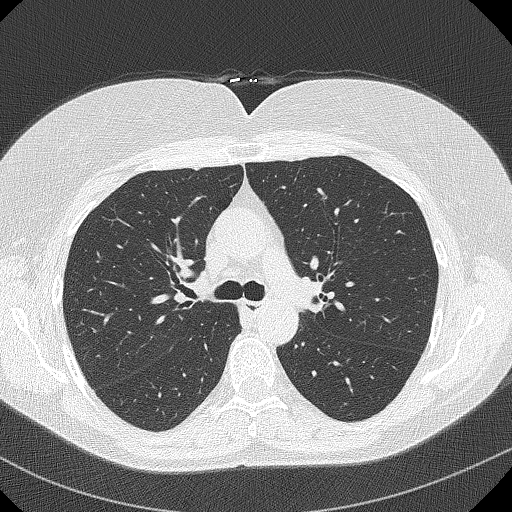
[im 226/331  mediastinal]
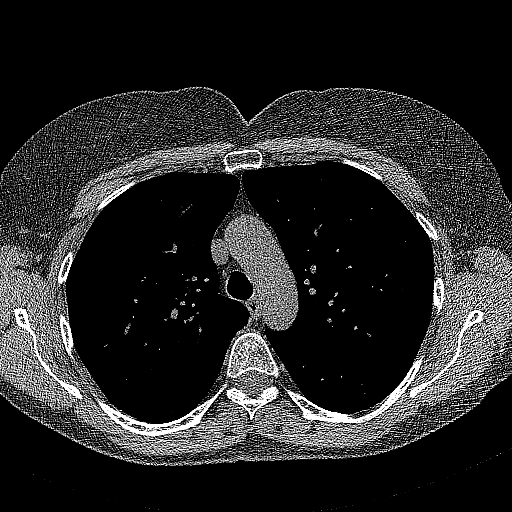
[im 226/331  lung]
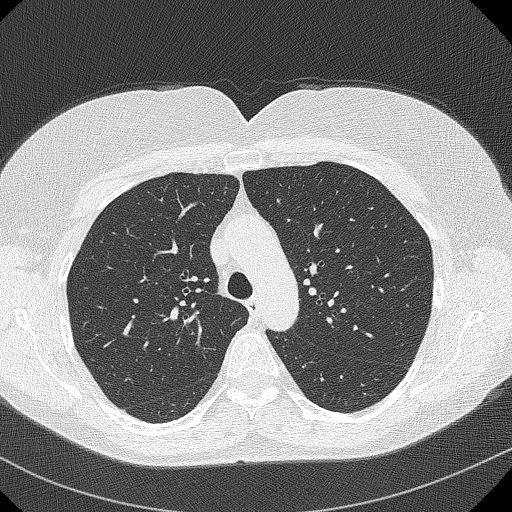
[im 256/331  lung]
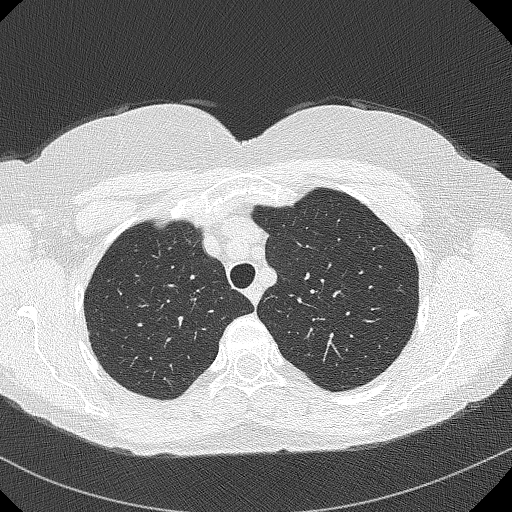
[im 286/331  lung]
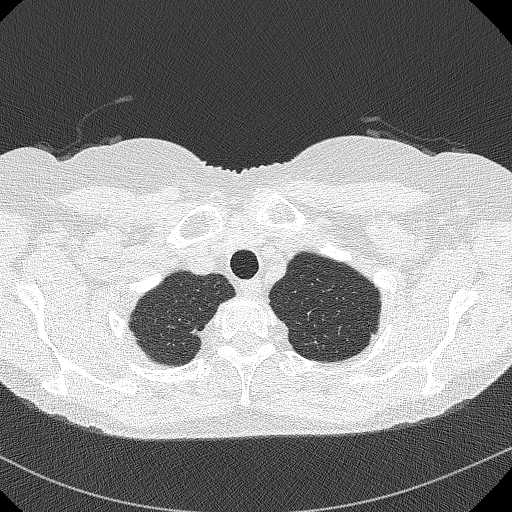
[im 316/331  lung]
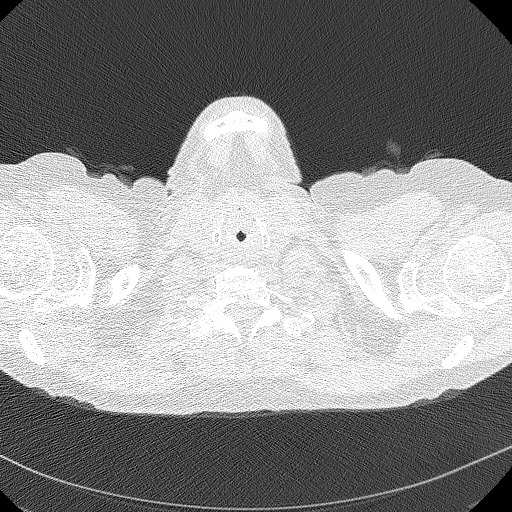

[Series 4: lung · coronal · 0.61mm/px · 3 of 288 slices shown (2 of 2)]
[im 58/288  lung]
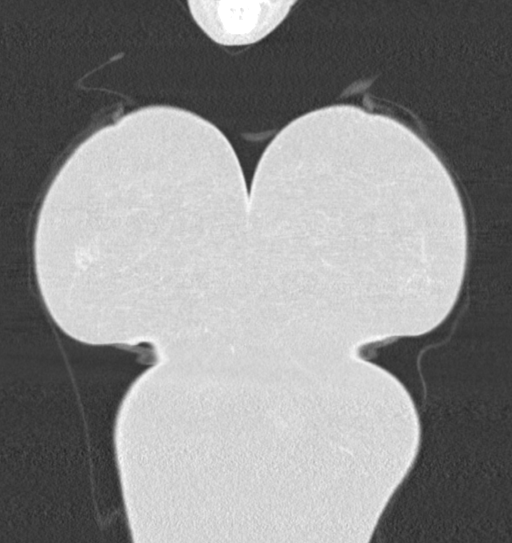
[im 115/288  lung]
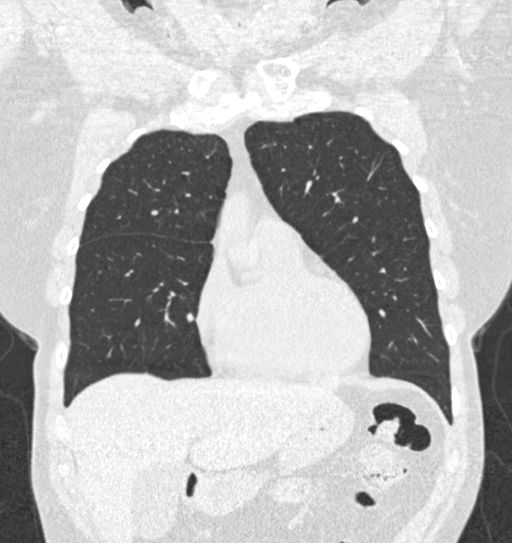
[im 173/288  lung]
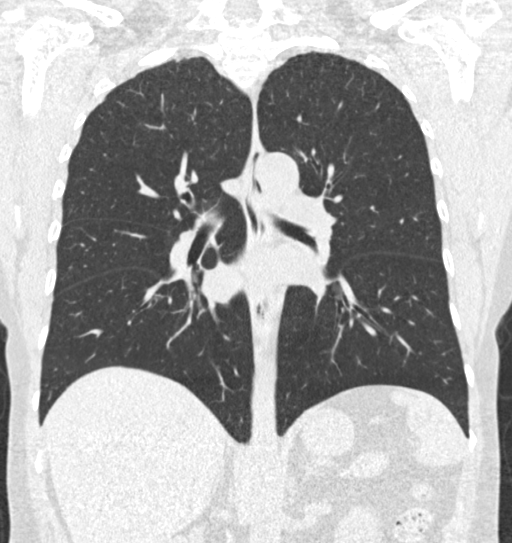

[15 of 40 positions shown; findings below may reference images not displayed]

FINDINGS: Cardiovascular: Heart size is normal. There is no significant
pericardial fluid, thickening or pericardial calcification. No
significant atherosclerotic calcifications in the thoracic aorta or
the coronary arteries.

Mediastinum/Nodes: No pathologically enlarged mediastinal or hilar
lymph nodes. Please note that accurate exclusion of hilar adenopathy
is limited on noncontrast CT scans. Esophagus is unremarkable in
appearance. No axillary lymphadenopathy.

Lungs/Pleura: Multiple tiny calcified pulmonary nodules, compatible
with benign calcified granulomas. No other larger more suspicious
appearing pulmonary nodules or masses are noted. No acute
consolidative airspace disease. No pleural effusions.

Upper Abdomen: Atherosclerotic calcifications in the renal arteries
bilaterally.

Musculoskeletal: There are no aggressive appearing lytic or blastic
lesions noted in the visualized portions of the skeleton.
IMPRESSION: 1. Lung-RADS 1, negative. Continue annual screening with low-dose
chest CT without contrast in 12 months.
2. Atherosclerosis.

## 2019-06-26 ENCOUNTER — Ambulatory Visit: Admission: RE | Admit: 2019-06-26 | Payer: Medicare Other | Source: Home / Self Care | Admitting: Gastroenterology

## 2019-06-26 ENCOUNTER — Encounter: Admission: RE | Payer: Self-pay | Source: Home / Self Care

## 2019-06-26 SURGERY — COLONOSCOPY WITH PROPOFOL
Anesthesia: General

## 2019-08-19 ENCOUNTER — Other Ambulatory Visit: Payer: Self-pay | Admitting: Family Medicine

## 2019-08-19 DIAGNOSIS — F32A Depression, unspecified: Secondary | ICD-10-CM

## 2019-08-19 DIAGNOSIS — F329 Major depressive disorder, single episode, unspecified: Secondary | ICD-10-CM

## 2019-08-19 DIAGNOSIS — K219 Gastro-esophageal reflux disease without esophagitis: Secondary | ICD-10-CM

## 2019-08-30 ENCOUNTER — Other Ambulatory Visit: Payer: Self-pay | Admitting: Family Medicine

## 2019-08-30 DIAGNOSIS — F32A Depression, unspecified: Secondary | ICD-10-CM

## 2019-08-30 DIAGNOSIS — F329 Major depressive disorder, single episode, unspecified: Secondary | ICD-10-CM

## 2019-09-04 ENCOUNTER — Telehealth: Payer: Self-pay

## 2019-09-04 NOTE — Telephone Encounter (Signed)
Pt called stating her Zyrtec was not working, "cannot come in office because I cannot wear a mask" and something about her insurance. I returned her call, but had to leave a message on vm. Told her she could pick up Allegra or loratadine/ claritin otc and give that a try.

## 2019-10-27 ENCOUNTER — Other Ambulatory Visit: Payer: Self-pay | Admitting: Family Medicine

## 2019-11-14 ENCOUNTER — Ambulatory Visit: Payer: Medicare Other | Admitting: Family Medicine

## 2019-11-17 ENCOUNTER — Encounter: Payer: Self-pay | Admitting: Family Medicine

## 2019-11-17 ENCOUNTER — Other Ambulatory Visit: Payer: Self-pay

## 2019-11-17 ENCOUNTER — Ambulatory Visit (INDEPENDENT_AMBULATORY_CARE_PROVIDER_SITE_OTHER): Payer: Medicare Other | Admitting: Family Medicine

## 2019-11-17 VITALS — BP 128/76 | HR 76 | Ht 61.0 in | Wt 138.0 lb

## 2019-11-17 DIAGNOSIS — G8929 Other chronic pain: Secondary | ICD-10-CM | POA: Diagnosis not present

## 2019-11-17 DIAGNOSIS — J3089 Other allergic rhinitis: Secondary | ICD-10-CM | POA: Diagnosis not present

## 2019-11-17 DIAGNOSIS — K219 Gastro-esophageal reflux disease without esophagitis: Secondary | ICD-10-CM

## 2019-11-17 DIAGNOSIS — L2082 Flexural eczema: Secondary | ICD-10-CM

## 2019-11-17 DIAGNOSIS — F329 Major depressive disorder, single episode, unspecified: Secondary | ICD-10-CM

## 2019-11-17 DIAGNOSIS — J449 Chronic obstructive pulmonary disease, unspecified: Secondary | ICD-10-CM

## 2019-11-17 DIAGNOSIS — M545 Low back pain: Secondary | ICD-10-CM

## 2019-11-17 DIAGNOSIS — F32A Depression, unspecified: Secondary | ICD-10-CM

## 2019-11-17 MED ORDER — MONTELUKAST SODIUM 10 MG PO TABS: 10.0000 mg | ORAL_TABLET | Freq: Every day | ORAL | 1 refills | Status: DC

## 2019-11-17 MED ORDER — PROAIR HFA 108 (90 BASE) MCG/ACT IN AERS: INHALATION_SPRAY | RESPIRATORY_TRACT | 6 refills | Status: DC

## 2019-11-17 MED ORDER — DICLOFENAC SODIUM 1 % EX GEL: 2.0000 g | Freq: Four times a day (QID) | CUTANEOUS | 5 refills | Status: DC

## 2019-11-17 MED ORDER — TRIAMCINOLONE ACETONIDE 0.1 % EX CREA: TOPICAL_CREAM | CUTANEOUS | 0 refills | Status: DC

## 2019-11-17 MED ORDER — DEXILANT 60 MG PO CPDR: 1.0000 | DELAYED_RELEASE_CAPSULE | Freq: Every day | ORAL | 1 refills | Status: DC

## 2019-11-17 MED ORDER — CYCLOBENZAPRINE HCL 10 MG PO TABS: 10.0000 mg | ORAL_TABLET | Freq: Three times a day (TID) | ORAL | 1 refills | Status: DC | PRN

## 2019-11-17 NOTE — Progress Notes (Signed)
Date:  11/17/2019   Name:  Sarah Mcdonald   DOB:  1956/04/04   MRN:  [id number]   Chief Complaint: Allergic Rhinitis  (wants something other than zyrtec), Spasms (takes cyclobenzaprine to "relax my muscles and help me sleep"), and Gastroesophageal Reflux (pt to call Dr Servando Snare and get back in/ has been less than a year since seeing him and having endoscopy)  Gastroesophageal Reflux She reports no abdominal pain, no belching, no chest pain, no choking, no coughing, no dysphagia, no early satiety, no globus sensation, no heartburn, no hoarse voice, no nausea, no sore throat, no stridor, no tooth decay, no water brash or no wheezing. This is a chronic problem. The current episode started more than 1 year ago. The problem occurs occasionally. The problem has been gradually improving. The symptoms are aggravated by certain foods. Pertinent negatives include no anemia, fatigue, melena, muscle weakness, orthopnea or weight loss. She has tried a PPI for the symptoms. The treatment provided moderate relief.  URI  This is a chronic (for allergic rhinitis) problem. The current episode started more than 1 year ago. The problem has been waxing and waning. There has been no fever. Associated symptoms include congestion and rhinorrhea. Pertinent negatives include no abdominal pain, chest pain, coughing, diarrhea, dysuria, ear pain, headaches, joint pain, joint swelling, nausea, neck pain, plugged ear sensation, rash, sinus pain, sneezing, sore throat, swollen glands, vomiting or wheezing. She has tried antihistamine for the symptoms. The treatment provided moderate relief.  Back Pain This is a chronic problem. The current episode started more than 1 year ago. The problem has been waxing and waning since onset. The pain is present in the lumbar spine. The quality of the pain is described as aching. The pain is moderate. The symptoms are aggravated by bending and twisting. Pertinent negatives include no abdominal pain,  bladder incontinence, bowel incontinence, chest pain, dysuria, fever, headaches, numbness, pelvic pain, tingling, weakness or weight loss.    No results found for: CREATININE, BUN, NA, K, CL, CO2 No results found for: CHOL, HDL, LDLCALC, LDLDIRECT, TRIG, CHOLHDL No results found for: TSH No results found for: HGBA1C   Review of Systems  Constitutional: Negative.  Negative for chills, fatigue, fever, unexpected weight change and weight loss.  HENT: Positive for congestion and rhinorrhea. Negative for ear discharge, ear pain, hoarse voice, sinus pressure, sinus pain, sneezing and sore throat.   Eyes: Negative for photophobia, pain, discharge, redness and itching.  Respiratory: Negative for cough, choking, shortness of breath, wheezing and stridor.   Cardiovascular: Negative for chest pain.  Gastrointestinal: Negative for abdominal pain, blood in stool, bowel incontinence, constipation, diarrhea, dysphagia, heartburn, melena, nausea and vomiting.  Endocrine: Negative for cold intolerance, heat intolerance, polydipsia, polyphagia and polyuria.  Genitourinary: Negative for bladder incontinence, dysuria, flank pain, frequency, hematuria, menstrual problem, pelvic pain, urgency, vaginal bleeding and vaginal discharge.  Musculoskeletal: Positive for back pain. Negative for arthralgias, joint pain, myalgias, muscle weakness and neck pain.  Skin: Negative for rash.  Allergic/Immunologic: Negative for environmental allergies and food allergies.  Neurological: Negative for dizziness, tingling, weakness, light-headedness, numbness and headaches.  Hematological: Negative for adenopathy. Does not bruise/bleed easily.  Psychiatric/Behavioral: Negative for dysphoric mood. The patient is not nervous/anxious.     Patient Active Problem List   Diagnosis Date Noted  . Abrasion of back 01/08/2018    Allergies  Allergen Reactions  . Seroquel [Quetiapine Fumarate] Other (See Comments)    Altered mental  status  . Amitriptyline  Past Surgical History:  Procedure Laterality Date  . ABDOMINAL HYSTERECTOMY    . BILATERAL CARPAL TUNNEL RELEASE    . CYST REMOVAL HAND    . ELBOW SURGERY    . KNEE SURGERY    . LIVER BIOPSY    . MOUTH SURGERY    . NOSE SURGERY     deviated septal repair  . OVARIAN CYST REMOVAL    . TONSILLECTOMY    . TUBAL LIGATION      Social History   Tobacco Use  . Smoking status: Former Smoker    Packs/day: 1.00    Years: 41.00    Pack years: 41.00    Types: Cigarettes    Quit date: 09/14/2014    Years since quitting: 5.1  . Smokeless tobacco: Never Used  . Tobacco comment: smoking cessation materials not required  Substance Use Topics  . Alcohol use: Yes    Alcohol/week: 6.0 standard drinks    Types: 6 Cans of beer per week    Comment: 6 pack per week  . Drug use: No     Medication list has been reviewed and updated.  Current Meds  Medication Sig  . busPIRone (BUSPAR) 15 MG tablet Take 15 mg by mouth 2 (two) times daily.  . clonazePAM (KLONOPIN) 1 MG tablet Take 1 mg by mouth 2 (two) times daily. Psych  . cyclobenzaprine (FLEXERIL) 10 MG tablet Take 1 tablet (10 mg total) by mouth 3 (three) times daily as needed for muscle spasms.  . hydrOXYzine (ATARAX/VISTARIL) 10 MG tablet TAKE 1 TABLET BY MOUTH THREE TIMES DAILY AS NEEDED (Patient taking differently: psych)  . PROAIR HFA 108 (90 Base) MCG/ACT inhaler INHALE 2 PUFFS BY MOUTH EVERY 6 HOURS AS NEEDED FOR WHEEZING AND FOR SHORTNESS OF BREATH  . triamcinolone cream (KENALOG) 0.1 % APPLY  CREAM TOPICALLY TWICE DAILY  . venlafaxine XR (EFFEXOR-XR) 75 MG 24 hr capsule Take 75 mg by mouth daily. psych  . [DISCONTINUED] cetirizine (EQ ALLERGY RELIEF, CETIRIZINE,) 10 MG tablet Take 1 tablet (10 mg total) by mouth daily.    PHQ 2/9 Scores 11/17/2019 01/13/2019 01/09/2018 01/08/2018  PHQ - 2 Score 0 2 2 3   PHQ- 9 Score 2 11 16 12     BP Readings from Last 3 Encounters:  11/17/19 128/76  03/13/19 128/88   02/25/19 130/70    Physical Exam Vitals and nursing note reviewed.  Constitutional:      Appearance: She is well-developed.  HENT:     Head: Normocephalic.     Right Ear: Tympanic membrane, ear canal and external ear normal.     Left Ear: Tympanic membrane, ear canal and external ear normal.     Nose: Nose normal.     Mouth/Throat:     Mouth: Mucous membranes are moist.  Eyes:     General: Lids are everted, no foreign bodies appreciated. No scleral icterus.       Left eye: No foreign body or hordeolum.     Conjunctiva/sclera: Conjunctivae normal.     Right eye: Right conjunctiva is not injected.     Left eye: Left conjunctiva is not injected.     Pupils: Pupils are equal, round, and reactive to light.  Neck:     Thyroid: No thyromegaly.     Vascular: No JVD.     Trachea: No tracheal deviation.  Cardiovascular:     Rate and Rhythm: Normal rate and regular rhythm.     Heart sounds: Normal heart sounds. No murmur.  No friction rub. No gallop.   Pulmonary:     Effort: Pulmonary effort is normal. No respiratory distress.     Breath sounds: Normal breath sounds. No wheezing or rales.  Abdominal:     General: Bowel sounds are normal.     Palpations: Abdomen is soft. There is no mass.     Tenderness: There is no abdominal tenderness. There is no guarding or rebound.  Musculoskeletal:        General: No tenderness. Normal range of motion.     Cervical back: Normal range of motion and neck supple.  Lymphadenopathy:     Cervical: No cervical adenopathy.  Skin:    General: Skin is warm.     Findings: No rash.  Neurological:     Mental Status: She is alert and oriented to person, place, and time.     Cranial Nerves: No cranial nerve deficit.     Deep Tendon Reflexes: Reflexes normal.  Psychiatric:        Mood and Affect: Mood is not anxious or depressed.     Wt Readings from Last 3 Encounters:  11/17/19 138 lb (62.6 kg)  03/21/19 133 lb (60.3 kg)  03/13/19 133 lb (60.3 kg)      BP 128/76   Pulse 76   Ht 5\' 1"  (1.549 m)   Wt 138 lb (62.6 kg)   BMI 26.07 kg/m   Assessment and Plan:  1. Depression, unspecified depression type Chronic.  Controlled.  Stable.  PHQ 0 gad score is 2 occasions controlled by psychiatrist including buspirone clonazepam venlafaxine XR.   2. Gastroesophageal reflux disease Chronic.  Controlled.  Stable.  Continue Dexilant 60 mg once a day. - dexlansoprazole (DEXILANT) 60 MG capsule; Take 1 capsule (60 mg total) by mouth daily.  Dispense: 90 capsule; Refill: 1  3. Flexural eczema Chronic.  Controlled.  Stable.  Continue hydroxyzine nightly and continue antihistamine Zyrtec/loratadine in the morning and Benadryl at night.  4. Chronic obstructive pulmonary disease, unspecified COPD type (HCC) Chronic.  Controlled.  Stable.  Continue ProAir 2 puffs every 6 hours as needed dyspnea. - PROAIR HFA 108 (90 Base) MCG/ACT inhaler; INHALE 2 PUFFS BY MOUTH EVERY 6 HOURS AS NEEDED FOR WHEEZING AND FOR SHORTNESS OF BREATH  Dispense: 8 g; Refill: 6

## 2019-12-01 DIAGNOSIS — F4002 Agoraphobia without panic disorder: Secondary | ICD-10-CM | POA: Diagnosis not present

## 2019-12-01 DIAGNOSIS — Z79899 Other long term (current) drug therapy: Secondary | ICD-10-CM | POA: Diagnosis not present

## 2019-12-01 DIAGNOSIS — F411 Generalized anxiety disorder: Secondary | ICD-10-CM | POA: Diagnosis not present

## 2019-12-01 DIAGNOSIS — F332 Major depressive disorder, recurrent severe without psychotic features: Secondary | ICD-10-CM | POA: Diagnosis not present

## 2020-01-14 ENCOUNTER — Ambulatory Visit (INDEPENDENT_AMBULATORY_CARE_PROVIDER_SITE_OTHER): Payer: Medicare Other

## 2020-01-14 ENCOUNTER — Other Ambulatory Visit: Payer: Self-pay

## 2020-01-14 VITALS — BP 142/88 | HR 92 | Temp 95.9°F | Resp 16 | Ht 61.0 in | Wt 137.6 lb

## 2020-01-14 DIAGNOSIS — Z1231 Encounter for screening mammogram for malignant neoplasm of breast: Secondary | ICD-10-CM | POA: Diagnosis not present

## 2020-01-14 DIAGNOSIS — Z0000 Encounter for general adult medical examination without abnormal findings: Secondary | ICD-10-CM

## 2020-01-14 NOTE — Progress Notes (Signed)
Subjective:   Sarah Mcdonald is a 64 y.o. female who presents for Medicare Annual (Subsequent) preventive examination.  Review of Systems:   Cardiac Risk Factors include: none     Objective:     Vitals: BP (!) 142/88 (BP Location: Right Arm, Patient Position: Sitting, Cuff Size: Normal)   Pulse 92   Temp (!) 95.9 F (35.5 C) (Temporal)   Resp 16   Ht 5\' 1"  (1.549 m)   Wt 137 lb 9.6 oz (62.4 kg)   SpO2 93%   BMI 26.00 kg/m   Body mass index is 26 kg/m.  Advanced Directives 01/14/2020 01/13/2019 01/09/2018 12/15/2017  Does Patient Have a Medical Advance Directive? Yes Yes No No  Type of 12/17/2017 of [place];Living will Living will;Healthcare Power of Attorney - -  Does patient want to make changes to medical advance directive? - Yes (MAU/Ambulatory/Procedural Areas - Information given) - -  Copy of Healthcare Power of Attorney in Chart? No - copy requested No - copy requested - -  Would patient like information on creating a medical advance directive? - - Yes (MAU/Ambulatory/Procedural Areas - Information given) -    Tobacco Social History   Tobacco Use  Smoking Status Former Smoker  . Packs/day: 1.00  . Years: 41.00  . Pack years: 41.00  . Types: Cigarettes  . Quit date: 09/14/2014  . Years since quitting: 5.3  Smokeless Tobacco Never Used  Tobacco Comment   smoking cessation materials not required     Counseling given: Not Answered Comment: smoking cessation materials not required   Clinical Intake:  Pre-visit preparation completed: Yes  Pain : 0-10 Pain Score: 4  Pain Type: Acute pain Pain Location: Finger (Comment which one) Pain Orientation: Mid, Left Pain Descriptors / Indicators: Sore, Tightness, Constant Pain Onset: In the past 7 days Pain Frequency: Constant     BMI - recorded: 26 Nutritional Status: BMI 25 -29 Overweight Nutritional Risks: Nausea/ vomitting/ diarrhea Diabetes: No  How often do you need to have  someone help you when you read instructions, pamphlets, or other written materials from your doctor or pharmacy?: 1 - Never  Interpreter Needed?: No  Information entered by :: [email or web address] LPN  Past Medical History:  Diagnosis Date  . ADHD   . ADHD   . Allergy   . Anxiety   . Asthma   . Back pain   . COPD (chronic obstructive pulmonary disease) (HCC)   . Depression   . Dyspnea   . Eczema   . Fibromyalgia   . GERD (gastroesophageal reflux disease)   . History of hepatitis C   . Vertigo    Past Surgical History:  Procedure Laterality Date  . ABDOMINAL HYSTERECTOMY    . BILATERAL CARPAL TUNNEL RELEASE    . CYST REMOVAL HAND    . ELBOW SURGERY    . KNEE SURGERY    . LIVER BIOPSY    . MOUTH SURGERY    . NOSE SURGERY     deviated septal repair  . OVARIAN CYST REMOVAL    . TONSILLECTOMY    . TUBAL LIGATION     Family History  Problem Relation Age of Onset  . Heart failure Mother   . Emphysema Mother   . Obesity Mother   . Healthy Father   . Cancer Sister   . Cancer Brother   . Cancer Sister        throat  . COPD Brother   . Hepatitis C  Brother   . Pneumonia Sister    Social History   Socioeconomic History  . Marital status: Divorced    Spouse name: Not on file  . Number of children: 3  . Years of education: some college  . Highest education level: 12th grade  Occupational History  . Occupation: Disabled  Tobacco Use  . Smoking status: Former Smoker    Packs/day: 1.00    Years: 41.00    Pack years: 41.00    Types: Cigarettes    Quit date: 09/14/2014    Years since quitting: 5.3  . Smokeless tobacco: Never Used  . Tobacco comment: smoking cessation materials not required  Substance and Sexual Activity  . Alcohol use: Yes    Alcohol/week: 6.0 standard drinks    Types: 6 Cans of beer per week    Comment: 6 pack per week  . Drug use: No  . Sexual activity: Yes  Other Topics Concern  . Not on file  Social History Narrative   Lives with long time  partner whom she calls her husband   Social Determinants of Health   Financial Resource Strain: Medium Risk  . Difficulty of Paying Living Expenses: Somewhat hard  Food Insecurity: No Food Insecurity  . Worried About Programme researcher, broadcasting/film/video in the Last Year: Never true  . Ran Out of Food in the Last Year: Never true  Transportation Needs: No Transportation Needs  . Lack of Transportation (Medical): No  . Lack of Transportation (Non-Medical): No  Physical Activity: Sufficiently Active  . Days of Exercise per Week: 7 days  . Minutes of Exercise per Session: 30 min  Stress: No Stress Concern Present  . Feeling of Stress : Only a little  Social Connections: Moderately Isolated  . Frequency of Communication with Friends and Family: More than three times a week  . Frequency of Social Gatherings with Friends and Family: Three times a week  . Attends Religious Services: Never  . Active Member of Clubs or Organizations: No  . Attends Banker Meetings: Never  . Marital Status: Divorced    Outpatient Encounter Medications as of 01/14/2020  Medication Sig  . busPIRone (BUSPAR) 15 MG tablet Take 15 mg by mouth 2 (two) times daily.  . clonazePAM (KLONOPIN) 1 MG tablet Take 1 mg by mouth 2 (two) times daily. Psych  . cyclobenzaprine (FLEXERIL) 10 MG tablet Take 1 tablet (10 mg total) by mouth 3 (three) times daily as needed for muscle spasms.  Marland Kitchen dexlansoprazole (DEXILANT) 60 MG capsule Take 1 capsule (60 mg total) by mouth daily.  . diclofenac Sodium (VOLTAREN) 1 % GEL Apply 2 g topically 4 (four) times daily.  . hydrOXYzine (ATARAX/VISTARIL) 10 MG tablet TAKE 1 TABLET BY MOUTH THREE TIMES DAILY AS NEEDED (Patient taking differently: psych)  . montelukast (SINGULAIR) 10 MG tablet Take 1 tablet (10 mg total) by mouth at bedtime.  Marland Kitchen PROAIR HFA 108 (90 Base) MCG/ACT inhaler INHALE 2 PUFFS BY MOUTH EVERY 6 HOURS AS NEEDED FOR WHEEZING AND FOR SHORTNESS OF BREATH  . triamcinolone cream  (KENALOG) 0.1 % APPLY  CREAM TOPICALLY TWICE DAILY  . venlafaxine XR (EFFEXOR-XR) 75 MG 24 hr capsule Take 75 mg by mouth daily. psych  . sertraline (ZOLOFT) 25 MG tablet Take 1 tablet by mouth daily.   No facility-administered encounter medications on file as of 01/14/2020.    Activities of Daily Living In your present state of health, do you have any difficulty performing the following activities:  01/14/2020  Hearing? N  Comment declines hearing aids  Vision? N  Difficulty concentrating or making decisions? N  Walking or climbing stairs? N  Dressing or bathing? N  Doing errands, shopping? N  Preparing Food and eating ? N  Using the Toilet? N  In the past six months, have you accidently leaked urine? N  Do you have problems with loss of bowel control? N  Managing your Medications? N  Managing your Finances? N  Housekeeping or managing your Housekeeping? N  Some recent data might be hidden    Patient Care Team: Duanne Limerick, MD as PCP - General (Family Medicine) Midge Minium, MD as Consulting Physician (Gastroenterology) Erin Fulling, MD as Consulting Physician (Pulmonary Disease)    Assessment:   This is a routine wellness examination for Markeeta.  Exercise Activities and Dietary recommendations Current Exercise Habits: Home exercise routine, Type of exercise: walking, Time (Minutes): 30, Frequency (Times/Week): 7, Weekly Exercise (Minutes/Week): 210, Exercise limited by: None identified  Goals    . DIET - INCREASE WATER INTAKE     Recommend to drink at least 6-8 8oz glasses of water per day.       Fall Risk Fall Risk  01/14/2020 01/13/2019 01/09/2018  Falls in the past year? 0 0 No  Number falls in past yr: 0 0 -  Injury with Fall? 0 0 -  Risk for fall due to : - - History of fall(s);Impaired balance/gait;Impaired vision  Risk for fall due to: Comment - - vertigo; wears eyeglasses  Follow up Falls prevention discussed - -   FALL RISK PREVENTION PERTAINING TO THE  HOME:  Any stairs in or around the home? Yes  If so, do they handrails? No   Home free of loose throw rugs in walkways, pet beds, electrical cords, etc? Yes  Adequate lighting in your home to reduce risk of falls? Yes   ASSISTIVE DEVICES UTILIZED TO PREVENT FALLS:  Life alert? No  Use of a cane, walker or w/c? No  Grab bars in the bathroom? No  Shower chair or bench in shower? No  Elevated toilet seat or a handicapped toilet? No   DME ORDERS:  DME order needed?  No   TIMED UP AND GO:  Was the test performed? Yes .  Length of time to ambulate 10 feet: 5 sec.   GAIT:  Appearance of gait: Gait stead-fast and without the use of an assistive device.    Education: Fall risk prevention has been discussed.  Intervention(s) required? No   Depression Screen PHQ 2/9 Scores 01/14/2020 11/17/2019 01/13/2019 01/09/2018  PHQ - 2 Score 0 0 2 2  PHQ- 9 Score - 2 11 16      Cognitive Function 6CIT deferred for 2021 AWV; pt has no memory issues     6CIT Screen 01/13/2019 01/09/2018  What Year? 0 points 0 points  What month? 0 points 0 points  What time? 0 points 0 points  Count back from 20 0 points 0 points  Months in reverse 0 points 0 points  Repeat phrase 0 points 0 points  Total Score 0 0    Immunization History  Administered Date(s) Administered  . Influenza,inj,Quad PF,6+ Mos 08/30/2017, 08/20/2018  . Influenza-Unspecified 07/30/2017  . Tdap 03/30/2010    Qualifies for Shingles Vaccine? Yes . Due for Shingrix. Education has been provided regarding the importance of this vaccine. Pt has been advised to call insurance company to determine out of pocket expense. Advised may also receive vaccine  at local pharmacy or Health Dept. Verbalized acceptance and understanding.  Tdap: Up to date  Flu Vaccine: Due for Flu vaccine. Does the patient want to receive this vaccine today?  No . Education has been provided regarding the importance of this vaccine but still declined. Advised may  receive this vaccine at local pharmacy or Health Dept. Aware to provide a copy of the vaccination record if obtained from local pharmacy or Health Dept. Verbalized acceptance and understanding.  Pneumococcal Vaccine: Due for Pneumococcal vaccine at age 26.   Screening Tests Health Maintenance  Topic Date Due  . INFLUENZA VACCINE  01/28/2020 (Originally 05/31/2019)  . PAP SMEAR-Modifier  04/06/2020 (Originally 10/30/2010)  . MAMMOGRAM  11/16/2020 (Originally 10/30/2016)  . Hepatitis C Screening  11/16/2020 (Originally 08-18-1956)  . TETANUS/TDAP  03/30/2020  . COLONOSCOPY  10/30/2021  . HIV Screening  Completed    Cancer Screenings:  Colorectal Screening: Completed 2013. Repeat every 10 years;   Mammogram: Completed 2017. Repeat every year. Ordered today. Pt provided with contact information and advised to call to schedule appt.   Bone Density: due at age 19  Lung Cancer Screening: (Low Dose CT Chest recommended if Age 32-80 years, 30 pack-year currently smoking OR have quit w/in 15years.) does qualify. Completed 03/21/19.  Additional Screening:  Hepatitis C Screening: not indicated; pt has history of hep C  Vision Screening: Recommended annual ophthalmology exams for early detection of glaucoma and other disorders of the eye. Is the patient up to date with their annual eye exam?  No  Who is the provider or what is the name of the office in which the pt attends annual eye exams? [place] Screening: Recommended annual dental exams for proper oral hygiene  Community Resource Referral:  CRR required this visit?  No      Plan:     I have personally reviewed and addressed the Medicare Annual Wellness questionnaire and have noted the following in the patient's chart:  A. Medical and social history B. Use of alcohol, tobacco or illicit drugs  C. Current medications and supplements D. Functional ability and status E.  Nutritional status F.  Physical activity G. Advance  directives H. List of other physicians I.  Hospitalizations, surgeries, and ER visits in previous 12 months J.  [place] such as hearing and vision if needed, cognitive and depression L. Referrals and appointments   In addition, I have reviewed and discussed with patient certain preventive protocols, quality metrics, and best practice recommendations. A written personalized care plan for preventive services as well as general preventive health recommendations were provided to patient.   Signed,  Clemetine Marker, LPN Nurse Health Advisor   Nurse Notes: advised patient due for labs, scheduled to follow up with Dr. Ronnald Ramp 05/17/20.  Pt also c/o pain in left middle finger, specifically middle knuckle swollen for the past week. Pt denies any known injury but does have hx of arthritis and ganglion cyst in that hand. Pt advised to schedule appt if sxs worsen or persist.

## 2020-01-14 NOTE — Patient Instructions (Signed)
Sarah Mcdonald , Thank you for taking time to come for your Medicare Wellness Visit. I appreciate your ongoing commitment to your health goals. Please review the following plan we discussed and let me know if I can assist you in the future.   Screening recommendations/referrals: Colonoscopy: done 2013. Repeat in 2023 Mammogram: due. Please call [phone] to schedule your mammogram.  Bone Density: due age 64  Recommended yearly ophthalmology/optometry visit for glaucoma screening and checkup Recommended yearly dental visit for hygiene and checkup  Vaccinations: Influenza vaccine: postponed Pneumococcal vaccine: due age 59 Tdap vaccine: done 03/30/10 Shingles vaccine: Shingrix discussed. Please contact your pharmacy for coverage information.   Advanced directives: Please bring a copy of your health care power of attorney and living will to the office at your convenience.  Conditions/risks identified: Recommend drinking 6-8 glasses of water per day  Next appointment: Please follow up in one year for your Medicare Annual Wellness visit.    Preventive Care 40-64 Years, Female Preventive care refers to lifestyle choices and visits with your health care provider that can promote health and wellness. What does preventive care include?  A yearly physical exam. This is also called an annual well check.  Dental exams once or twice a year.  Routine eye exams. Ask your health care provider how often you should have your eyes checked.  Personal lifestyle choices, including:  Daily care of your teeth and gums.  Regular physical activity.  Eating a healthy diet.  Avoiding tobacco and drug use.  Limiting alcohol use.  Practicing safe sex.  Taking low-dose aspirin daily starting at age 1.  Taking vitamin and mineral supplements as recommended by your health care provider. What happens during an annual well check? The services and screenings done by your health care provider during  your annual well check will depend on your age, overall health, lifestyle risk factors, and family history of disease. Counseling  Your health care provider may ask you questions about your:  Alcohol use.  Tobacco use.  Drug use.  Emotional well-being.  Home and relationship well-being.  Sexual activity.  Eating habits.  Work and work Statistician.  Method of birth control.  Menstrual cycle.  Pregnancy history. Screening  You may have the following tests or measurements:  Height, weight, and BMI.  Blood pressure.  Lipid and cholesterol levels. These may be checked every 5 years, or more frequently if you are over 40 years old.  Skin check.  Lung cancer screening. You may have this screening every year starting at age 24 if you have a 30-pack-year history of smoking and currently smoke or have quit within the past 15 years.  Fecal occult blood test (FOBT) of the stool. You may have this test every year starting at age 58.  Flexible sigmoidoscopy or colonoscopy. You may have a sigmoidoscopy every 5 years or a colonoscopy every 10 years starting at age 78.  Hepatitis C blood test.  Hepatitis B blood test.  Sexually transmitted disease (STD) testing.  Diabetes screening. This is done by checking your blood sugar (glucose) after you have not eaten for a while (fasting). You may have this done every 1-3 years.  Mammogram. This may be done every 1-2 years. Talk to your health care provider about when you should start having regular mammograms. This may depend on whether you have a family history of breast cancer.  BRCA-related cancer screening. This may be done if you have a family history of breast, ovarian, tubal, or peritoneal cancers.  Pelvic exam and Pap test. This may be done every 3 years starting at age 60. Starting at age 97, this may be done every 5 years if you have a Pap test in combination with an HPV test.  Bone density scan. This is done to screen for  osteoporosis. You may have this scan if you are at high risk for osteoporosis. Discuss your test results, treatment options, and if necessary, the need for more tests with your health care provider. Vaccines  Your health care provider may recommend certain vaccines, such as:  Influenza vaccine. This is recommended every year.  Tetanus, diphtheria, and acellular pertussis (Tdap, Td) vaccine. You may need a Td booster every 10 years.  Zoster vaccine. You may need this after age 52.  Pneumococcal 13-valent conjugate (PCV13) vaccine. You may need this if you have certain conditions and were not previously vaccinated.  Pneumococcal polysaccharide (PPSV23) vaccine. You may need one or two doses if you smoke cigarettes or if you have certain conditions. Talk to your health care provider about which screenings and vaccines you need and how often you need them. This information is not intended to replace advice given to you by your health care provider. Make sure you discuss any questions you have with your health care provider. Document Released: 11/12/2015 Document Revised: 07/05/2016 Document Reviewed: 08/17/2015 Elsevier Interactive Patient Education  2017 [employer] Prevention in the Home Falls can cause injuries. They can happen to people of all ages. There are many things you can do to make your home safe and to help prevent falls. What can I do on the outside of my home?  Regularly fix the edges of walkways and driveways and fix any cracks.  Remove anything that might make you trip as you walk through a door, such as a raised step or threshold.  Trim any bushes or trees on the path to your home.  Use bright outdoor lighting.  Clear any walking paths of anything that might make someone trip, such as rocks or tools.  Regularly check to see if handrails are loose or broken. Make sure that both sides of any steps have handrails.  Any raised decks and porches should have  guardrails on the edges.  Have any leaves, snow, or ice cleared regularly.  Use sand or salt on walking paths during winter.  Clean up any spills in your garage right away. This includes oil or grease spills. What can I do in the bathroom?  Use night lights.  Install grab bars by the toilet and in the tub and shower. Do not use towel bars as grab bars.  Use non-skid mats or decals in the tub or shower.  If you need to sit down in the shower, use a plastic, non-slip stool.  Keep the floor dry. Clean up any water that spills on the floor as soon as it happens.  Remove soap buildup in the tub or shower regularly.  Attach bath mats securely with double-sided non-slip rug tape.  Do not have throw rugs and other things on the floor that can make you trip. What can I do in the bedroom?  Use night lights.  Make sure that you have a light by your bed that is easy to reach.  Do not use any sheets or blankets that are too big for your bed. They should not hang down onto the floor.  Have a firm chair that has side arms. You can use this for  support while you get dressed.  Do not have throw rugs and other things on the floor that can make you trip. What can I do in the kitchen?  Clean up any spills right away.  Avoid walking on wet floors.  Keep items that you use a lot in easy-to-reach places.  If you need to reach something above you, use a strong step stool that has a grab bar.  Keep electrical cords out of the way.  Do not use floor polish or wax that makes floors slippery. If you must use wax, use non-skid floor wax.  Do not have throw rugs and other things on the floor that can make you trip. What can I do with my stairs?  Do not leave any items on the stairs.  Make sure that there are handrails on both sides of the stairs and use them. Fix handrails that are broken or loose. Make sure that handrails are as long as the stairways.  Check any carpeting to make sure that  it is firmly attached to the stairs. Fix any carpet that is loose or worn.  Avoid having throw rugs at the top or bottom of the stairs. If you do have throw rugs, attach them to the floor with carpet tape.  Make sure that you have a light switch at the top of the stairs and the bottom of the stairs. If you do not have them, ask someone to add them for you. What else can I do to help prevent falls?  Wear shoes that:  Do not have high heels.  Have rubber bottoms.  Are comfortable and fit you well.  Are closed at the toe. Do not wear sandals.  If you use a stepladder:  Make sure that it is fully opened. Do not climb a closed stepladder.  Make sure that both sides of the stepladder are locked into place.  Ask someone to hold it for you, if possible.  Clearly mark and make sure that you can see:  Any grab bars or handrails.  First and last steps.  Where the edge of each step is.  Use tools that help you move around (mobility aids) if they are needed. These include:  Canes.  Walkers.  Scooters.  Crutches.  Turn on the lights when you go into a dark area. Replace any light bulbs as soon as they burn out.  Set up your furniture so you have a clear path. Avoid moving your furniture around.  If any of your floors are uneven, fix them.  If there are any pets around you, be aware of where they are.  Review your medicines with your doctor. Some medicines can make you feel dizzy. This can increase your chance of falling. Ask your doctor what other things that you can do to help prevent falls. This information is not intended to replace advice given to you by your health care provider. Make sure you discuss any questions you have with your health care provider. Document Released: 08/12/2009 Document Revised: 03/23/2016 Document Reviewed: 11/20/2014 Elsevier Interactive Patient Education  2017 [employer].

## 2020-03-03 ENCOUNTER — Telehealth: Payer: Self-pay

## 2020-03-03 NOTE — Telephone Encounter (Signed)
I contacted patient at [phone] to schedule her for annual CT lung screening scan.  Patient's last scan was 03/21/2019.  Patient states she would like to defer her scan this year as she "is trying to stay away from doctor's offices."  She would like to remain on the call list for next year.

## 2020-05-17 ENCOUNTER — Ambulatory Visit: Payer: Medicare Other | Admitting: Family Medicine

## 2020-05-25 ENCOUNTER — Ambulatory Visit: Payer: Medicare Other | Admitting: Family Medicine

## 2021-01-17 ENCOUNTER — Ambulatory Visit: Payer: Medicare Other

## 2021-04-01 ENCOUNTER — Telehealth: Payer: Self-pay

## 2021-04-01 NOTE — Telephone Encounter (Signed)
Patient has not been seen since January 2021. She needs to schedule a CPE with Dr Yetta Barre. Please call her and schedule. Let her know she needs to be seen to continue to stay established with Dr Yetta Barre.  Thank you!

## 2021-04-01 NOTE — Telephone Encounter (Signed)
Pt scheduled  

## 2021-04-04 ENCOUNTER — Ambulatory Visit: Payer: Medicare Other | Admitting: Family Medicine

## 2021-04-05 ENCOUNTER — Encounter: Payer: Self-pay | Admitting: Family Medicine

## 2021-04-05 ENCOUNTER — Telehealth: Payer: Self-pay

## 2021-04-05 ENCOUNTER — Other Ambulatory Visit: Payer: Self-pay

## 2021-04-05 ENCOUNTER — Ambulatory Visit: Payer: Medicare Other | Admitting: Family Medicine

## 2021-04-05 NOTE — Telephone Encounter (Signed)
Patient was being called back for her 1:40 appt. Pt was attempting to bring a visitor back when the CMA told her that she needed to go over some information first then the visitor could come back with her. The CMA also informed the pt that she needed to have her mask on as well as her visitor. The patient stated her visitor was coming back with her regardless. I came out of my office explaining to the patient the visitor could come back once we were finished with her check in process that involved some questions & that she needed to put her mask on while in the office. The patient stated she had it on which was tied around her knee. I politely informed the patient that in a healthcare setting it needs to be on her face covering her nose & mouth. The patient then told me she is wearing it as is & walked back into the waiting room. I informed the patient that if she wants to be seen then we will need to room her with a mask on & her visitor would be called back in a few min, the patient stormed out & said I guess you aren't seeing me.

## 2021-04-05 NOTE — Telephone Encounter (Signed)
Pt was called back to see Dr. Yetta Barre and motioned for husband to get up and come with her. I stated, "We need to take you back by yourself." Patient stated, "No, he is coming with me." I explained that we have questions to go over and then I would come back and get him. She refused, as well as she was not wearing a mask. I asked Amy for her assistance. Print production planner came out to hallway and told the patient we just needed to ask a few questions and then he could come back. The patient turned to walk out to waiting room while office manager was asking her to put her mask on as well. Patient stuck her leg out and said, I am wearing it." The mask was tied around her left kneecap. Print production planner explained she had to have mask on or she could not be seen. Patient said she was leaving then. Patient and husband walked out the door, turning left in the hallway.

## 2022-11-27 ENCOUNTER — Other Ambulatory Visit (HOSPITAL_COMMUNITY)
Admission: RE | Admit: 2022-11-27 | Discharge: 2022-11-27 | Disposition: A | Discharge status: Home / Self Care | Payer: 59 | Source: Ambulatory Visit | Attending: Internal Medicine | Admitting: Internal Medicine

## 2022-11-27 ENCOUNTER — Ambulatory Visit (INDEPENDENT_AMBULATORY_CARE_PROVIDER_SITE_OTHER): Payer: 59 | Admitting: Internal Medicine

## 2022-11-27 ENCOUNTER — Encounter: Payer: Self-pay | Admitting: Internal Medicine

## 2022-11-27 VITALS — BP 138/86 | HR 81 | Temp 96.8°F | Ht 61.0 in | Wt 125.0 lb

## 2022-11-27 DIAGNOSIS — N3941 Urge incontinence: Secondary | ICD-10-CM | POA: Diagnosis not present

## 2022-11-27 DIAGNOSIS — Z79899 Other long term (current) drug therapy: Secondary | ICD-10-CM

## 2022-11-27 DIAGNOSIS — F902 Attention-deficit hyperactivity disorder, combined type: Secondary | ICD-10-CM

## 2022-11-27 DIAGNOSIS — R7309 Other abnormal glucose: Secondary | ICD-10-CM

## 2022-11-27 DIAGNOSIS — F32A Depression, unspecified: Secondary | ICD-10-CM | POA: Diagnosis not present

## 2022-11-27 DIAGNOSIS — Z136 Encounter for screening for cardiovascular disorders: Secondary | ICD-10-CM

## 2022-11-27 DIAGNOSIS — F419 Anxiety disorder, unspecified: Secondary | ICD-10-CM | POA: Diagnosis not present

## 2022-11-27 DIAGNOSIS — N898 Other specified noninflammatory disorders of vagina: Secondary | ICD-10-CM | POA: Diagnosis present

## 2022-11-27 DIAGNOSIS — J449 Chronic obstructive pulmonary disease, unspecified: Secondary | ICD-10-CM

## 2022-11-27 DIAGNOSIS — M797 Fibromyalgia: Secondary | ICD-10-CM

## 2022-11-27 DIAGNOSIS — R739 Hyperglycemia, unspecified: Secondary | ICD-10-CM

## 2022-11-27 DIAGNOSIS — M545 Low back pain, unspecified: Secondary | ICD-10-CM

## 2022-11-27 DIAGNOSIS — Z8619 Personal history of other infectious and parasitic diseases: Secondary | ICD-10-CM

## 2022-11-27 LAB — POCT URINALYSIS DIPSTICK
Bilirubin, UA: NEGATIVE
Glucose, UA: NEGATIVE
Ketones, UA: NEGATIVE
Nitrite, UA: NEGATIVE
Protein, UA: NEGATIVE
Spec Grav, UA: 1.01 (ref 1.010–1.025)
Urobilinogen, UA: 0.2 E.U./dL
pH, UA: 5 (ref 5.0–8.0)

## 2022-11-27 NOTE — Assessment & Plan Note (Signed)
No longer smoking Continue albuterol as needed

## 2022-11-27 NOTE — Assessment & Plan Note (Signed)
Will check hep C antibody, genotype and RNA load today

## 2022-11-27 NOTE — Assessment & Plan Note (Signed)
Continue lidocaine patches as needed.  

## 2022-11-27 NOTE — Patient Instructions (Signed)
Attention Deficit Hyperactivity Disorder, Adult Attention deficit hyperactivity disorder (ADHD) is a mental health disorder that starts during childhood. For many people with ADHD, the disorder continues into the adult years. Treatment can help you manage your symptoms. There are three main types of ADHD: Inattentive. With this type, adults have difficulty paying attention. This may affect cognitive abilities. Hyperactive-impulsive. With this type, adults have a lot of energy and have difficulty controlling their behavior. Combination type. Some people may have symptoms of both types. What are the causes? The exact cause of ADHD is not known. Most experts believe a person's genes and environment possibly contribute to ADHD. What increases the risk? The following factors may make you more likely to develop this condition: Having a first-degree relative such as a parent, brother, or sister, with the condition. Being born before 37 weeks of pregnancy (prematurely) or at a low birth weight. Being born to a mother who smoked tobacco or drank alcohol during pregnancy. Having experienced a brain injury. Being exposed to lead or other toxins in the womb or early in life. What are the signs or symptoms? Symptoms of this condition depend on the type of ADHD. Symptoms of the inattentive type include: Difficulty paying attention or following instructions. Often making simple mistakes. Being disorganized. Avoiding tasks that require time and attention. Losing and forgetting things. Symptoms of the hyperactive-impulsive type include: Restlessness. Talking out of turn, interrupting others, or talking too much. Difficulty with: Sitting still. Feeling motivated. Relaxing. Waiting in line or waiting for a turn. People with the combination type have symptoms of both of the other types. In adults, this condition may lead to certain problems, such as: Keeping jobs. Performing tasks at work. Having  stable relationships. Being on time or keeping to a schedule. How is this diagnosed? This condition is diagnosed based on your current symptoms and your history of symptoms. The diagnosis can be made by a health care provider such as a primary care provider or a mental health care specialist. Your health care provider may use a symptom checklist or a behavior rating scale to evaluate your symptoms. Your health care provider may also want to talk with people who have observed your behaviors throughout your life. How is this treated? This condition can be treated with medicines and behavior therapy. Medicines may be the best option to reduce impulsive behaviors and improve attention. Your health care provider may recommend: Stimulant medicines. These are the most common medicines used for adult ADHD. They affect certain chemicals in the brain (neurotransmitters) and improve your ability to control your symptoms. A non-stimulant medicine. These medicines can also improve focus, attention, and impulsive behavior. It may take weeks to months to see the effects of this medicine. Counseling and behavioral management are also important for treating ADHD. Counseling is often used along with medicine. Your health care provider may suggest: Cognitive behavioral therapy (CBT). This type of therapy teaches you to replace negative thoughts and actions with positive thoughts and actions. When used as part of ADHD treatment, this therapy may also include: Coping strategies for organization, time management, impulse control, and stress reduction. Mindfulness and meditation training. Behavioral management. You may work with a coach who is specially trained to help people with ADHD manage and organize activities and function more effectively. Follow these instructions at home: Medicines  Take over-the-counter and prescription medicines only as told by your health care provider. Talk with your health care provider  about the possible side effects of your medicines and   how to manage them. Alcohol use Do not drink alcohol if: Your health care provider tells you not to drink. You are pregnant, may be pregnant, or are planning to become pregnant. If you drink alcohol: Limit how much you use to: 0-1 drink a day for women. 0-2 drinks a day for men. Know how much alcohol is in your drink. In the [place] one drink equals one 12 oz bottle of beer (355 mL), one 5 oz glass of wine (148 mL), or one 1 oz glass of hard liquor (44 mL). Lifestyle  Do not use illegal drugs. Get enough sleep. Eat a healthy diet. Exercise regularly. Exercise can help to reduce stress and anxiety. General instructions Learn as much as you can about adult ADHD, and work closely with your health care providers to find the treatments that work best for you. Follow the same schedule each day. Use reminder devices like notes, calendars, and phone apps to stay on time and organized. Keep all follow-up visits. Your health care provider will need to monitor your condition and adjust your treatment over time. Where to find more information A health care provider may be able to recommend resources that are available online or over the phone. You could start with: Attention Deficit Disorder Association (ADDA): add.org National Institute of Mental Health (NIMH): nimh.nih.gov Contact a health care provider if: Your symptoms continue to cause problems. You have side effects from your medicine, such as: Repeated muscle twitches, coughing, or speech outbursts. Sleep problems. Loss of appetite. Dizziness. Unusually fast heartbeat. Stomach pains. Headaches. You are struggling with anxiety, depression, or substance abuse. Get help right away if: You have a severe reaction to a medicine. This symptom may be an emergency. Get help right away. Call 911. Do not wait to see if the symptom will go away. Do not drive yourself to the hospital. Take  one of these steps if you feel like you may hurt yourself or others, or have thoughts about taking your own life: Go to your nearest emergency room. Call 911. Call the National Suicide Prevention Lifeline at 1-800-273-8255 or 988. This is open 24 hours a day Text the Crisis Text Line at 741741. Summary ADHD is a mental health disorder that starts during childhood and often continues into your adult years. The exact cause of ADHD is not known. Most experts believe genetics and environmental factors contribute to ADHD. There is no cure for ADHD, but treatment with medicine, cognitive behavioral therapy, or behavioral management can help you manage your condition. This information is not intended to replace advice given to you by your health care provider. Make sure you discuss any questions you have with your health care provider. Document Revised: 02/03/2022 Document Reviewed: 02/03/2022 Elsevier Patient Education  2023 [employer]  

## 2022-11-27 NOTE — Assessment & Plan Note (Signed)
Referral to psychology placed for formal ADHD testing

## 2022-11-27 NOTE — Assessment & Plan Note (Addendum)
She feels like this was misdiagnosed I will not be filling her clonazepam

## 2022-11-27 NOTE — Progress Notes (Signed)
HPI  Patient presents to clinic today to establish care and for management of the conditions listed below.  Anxiety and Depression: She thinks this was misdiagnosed, more ADHD. She is taking Clonazepam as needed. She no longer follows with therapy or a psychiatrist. She denies SI/HI.  COPD: She has a chronic cough and shortness of breath. She uses Albuterol as needed. She does not smoke. She does not follow with pulmonology.  ADHD: She reports lack of concentration. She is taking a friend's Adderall. She is not currently seeing a psychiatrist.  Chronic Back Pain/Fibromyalgia: She uses Lidocaine patches as needed with some relief of symptoms. She does not follow with pain managements.  GERD: Triggered by stress, tomato based foods. She is not currently taking any medications for this.  She was taking Dexilant in the past but reports this made her worse.  There is no upper GI on file.   History of Hep C: status post antiviral treatments. She no longer follows with GI.  Past Medical History:  Diagnosis Date   ADHD    ADHD    Allergy    Anxiety    Asthma    Back pain    COPD (chronic obstructive pulmonary disease) (HCC)    Depression    Dyspnea    Eczema    Fibromyalgia    GERD (gastroesophageal reflux disease)    History of hepatitis C    Vertigo     Current Outpatient Medications  Medication Sig Dispense Refill   busPIRone (BUSPAR) 15 MG tablet Take 15 mg by mouth 2 (two) times daily.     clonazePAM (KLONOPIN) 1 MG tablet Take 1 mg by mouth 2 (two) times daily. Psych     cyclobenzaprine (FLEXERIL) 10 MG tablet Take 1 tablet (10 mg total) by mouth 3 (three) times daily as needed for muscle spasms. 90 tablet 1   dexlansoprazole (DEXILANT) 60 MG capsule Take 1 capsule (60 mg total) by mouth daily. 90 capsule 1   diclofenac Sodium (VOLTAREN) 1 % GEL Apply 2 g topically 4 (four) times daily. 100 g 5   hydrOXYzine (ATARAX/VISTARIL) 10 MG tablet TAKE 1 TABLET BY MOUTH THREE TIMES  DAILY AS NEEDED (Patient taking differently: psych) 30 tablet 0   montelukast (SINGULAIR) 10 MG tablet Take 1 tablet (10 mg total) by mouth at bedtime. 90 tablet 1   PROAIR HFA 108 (90 Base) MCG/ACT inhaler INHALE 2 PUFFS BY MOUTH EVERY 6 HOURS AS NEEDED FOR WHEEZING AND FOR SHORTNESS OF BREATH 8 g 6   sertraline (ZOLOFT) 25 MG tablet Take 1 tablet by mouth daily.     triamcinolone cream (KENALOG) 0.1 % APPLY  CREAM TOPICALLY TWICE DAILY 453.6 g 0   venlafaxine XR (EFFEXOR-XR) 75 MG 24 hr capsule Take 75 mg by mouth daily. psych     No current facility-administered medications for this visit.    Allergies  Allergen Reactions   Seroquel [Quetiapine Fumarate] Other (See Comments)    Altered mental status   Amitriptyline     Family History  Problem Relation Age of Onset   Heart failure Mother    Emphysema Mother    Obesity Mother    Healthy Father    Cancer Sister    Cancer Brother    Cancer Sister        throat   COPD Brother    Hepatitis C Brother    Pneumonia Sister     Social History   Socioeconomic History   Marital status: Divorced  Spouse name: Not on file   Number of children: 3   Years of education: some college   Highest education level: 12th grade  Occupational History   Occupation: Disabled  Tobacco Use   Smoking status: Former    Packs/day: 1.00    Years: 41.00    Total pack years: 41.00    Types: Cigarettes    Quit date: 09/14/2014    Years since quitting: 8.2   Smokeless tobacco: Never   Tobacco comments:    smoking cessation materials not required  Vaping Use   Vaping Use: Former   Start date: 09/19/2014   Quit date: 05/19/2017  Substance and Sexual Activity   Alcohol use: Yes    Alcohol/week: 6.0 standard drinks of alcohol    Types: 6 Cans of beer per week    Comment: 6 pack per week   Drug use: No   Sexual activity: Yes  Other Topics Concern   Not on file  Social History Narrative   Lives with long time partner whom she calls her  husband   Social Determinants of Health   Financial Resource Strain: Medium Risk (01/14/2020)   Overall Financial Resource Strain (CARDIA)    Difficulty of Paying Living Expenses: Somewhat hard  Food Insecurity: No Food Insecurity (01/14/2020)   Hunger Vital Sign    Worried About Running Out of Food in the Last Year: Never true    Ran Out of Food in the Last Year: Never true  Transportation Needs: No Transportation Needs (01/14/2020)   PRAPARE - Administrator, Civil Service (Medical): No    Lack of Transportation (Non-Medical): No  Physical Activity: Sufficiently Active (01/14/2020)   Exercise Vital Sign    Days of Exercise per Week: 7 days    Minutes of Exercise per Session: 30 min  Stress: No Stress Concern Present (01/14/2020)   [employer] of Occupational Health - Occupational Stress Questionnaire    Feeling of Stress : Only a little  Social Connections: Socially Isolated (01/14/2020)   Social Connection and Isolation Panel [NHANES]    Frequency of Communication with Friends and Family: More than three times a week    Frequency of Social Gatherings with Friends and Family: Three times a week    Attends Religious Services: Never    Active Member of Clubs or Organizations: No    Attends Banker Meetings: Never    Marital Status: Divorced  Catering manager Violence: Not At Risk (01/14/2020)   Humiliation, Afraid, Rape, and Kick questionnaire    Fear of Current or Ex-Partner: No    Emotionally Abused: No    Physically Abused: No    Sexually Abused: No    ROS:  Constitutional: Denies fever, malaise, fatigue, headache or abrupt weight changes.  HEENT: Denies eye pain, eye redness, ear pain, ringing in the ears, wax buildup, runny nose, nasal congestion, bloody nose, or sore throat. Respiratory: Patient reports chronic cough and shortness of breath.  Denies difficulty breathing, or sputum production.   Cardiovascular: Denies chest pain, chest tightness,  palpitations or swelling in the hands or feet.  Gastrointestinal: Patient reports intermittent reflux.  Denies abdominal pain, bloating, constipation, diarrhea or blood in the stool.  GU: Patient reports urge incontinence and vaginal itching.  Denies frequency, pain with urination, blood in urine, odor or discharge. Musculoskeletal: Patient reports chronic joint and muscle pain.  Denies decrease in range of motion, difficulty with gait,  or joint swelling.  Skin: Denies redness, rashes, lesions  or ulcercations.  Neurological: Patient reports inattention.  Denies dizziness, difficulty with memory, difficulty with speech or problems with balance and coordination.  Psych: Patient has a history of anxiety and depression.  Denies SI/HI.  No other specific complaints in a complete review of systems (except as listed in HPI above).  PE:  BP 138/86 (BP Location: Right Arm, Patient Position: Sitting, Cuff Size: Normal)   Pulse 81   Temp (!) 96.8 F (36 C) (Temporal)   Ht 5\' 1"  (1.549 m)   Wt 125 lb (56.7 kg)   SpO2 99%   BMI 23.62 kg/m   Wt Readings from Last 3 Encounters:  01/14/20 137 lb 9.6 oz (62.4 kg)  11/17/19 138 lb (62.6 kg)  03/21/19 133 lb (60.3 kg)    General: Appears older than her stated age, chronically ill and, in NAD. HEENT: Head: normal shape and size; Eyes: sclera white, no icterus, conjunctiva pink, PERRLA and EOMs intact;  Cardiovascular: Normal rate and rhythm. S1,S2 noted.  No murmur, rubs or gallops noted. No JVD or BLE edema. No carotid bruits noted. Pulmonary/Chest: Normal effort and positive vesicular breath sounds. No respiratory distress. No wheezes, rales or ronchi noted.  Abdomen: Soft and nontender. Normal bowel sounds. Musculoskeletal:  No difficulty with gait.  Neurological: Alert and oriented.  Psychiatric: Mood and affect normal.  She seems paranoid. Judgment and thought content normal.     Assessment and Plan:  Vaginal Itching:  Wet prep  today  Urge Incontinence:  Urinalysis shows moderate blood and small leuks Will send urine culture  RTC in 6 months for your annual exam

## 2022-11-27 NOTE — Assessment & Plan Note (Signed)
Encourage regular stretching

## 2022-11-29 ENCOUNTER — Ambulatory Visit: Payer: 59 | Admitting: Internal Medicine

## 2022-11-29 LAB — CERVICOVAGINAL ANCILLARY ONLY
Bacterial Vaginitis (gardnerella): POSITIVE — AB
Candida Glabrata: NEGATIVE
Candida Vaginitis: NEGATIVE
Chlamydia: NEGATIVE
Comment: NEGATIVE
Comment: NEGATIVE
Comment: NEGATIVE
Comment: NEGATIVE
Comment: NEGATIVE
Comment: NORMAL
Neisseria Gonorrhea: NEGATIVE
Trichomonas: NEGATIVE

## 2022-11-29 LAB — URINE CULTURE
MICRO NUMBER:: 14486557
SPECIMEN QUALITY:: ADEQUATE

## 2022-11-30 LAB — COMPLETE METABOLIC PANEL WITH GFR
AG Ratio: 1.6 (calc) (ref 1.0–2.5)
ALT: 16 U/L (ref 6–29)
AST: 24 U/L (ref 10–35)
Albumin: 4.5 g/dL (ref 3.6–5.1)
Alkaline phosphatase (APISO): 51 U/L (ref 37–153)
BUN: 16 mg/dL (ref 7–25)
CO2: 24 mmol/L (ref 20–32)
Calcium: 9.2 mg/dL (ref 8.6–10.4)
Chloride: 107 mmol/L (ref 98–110)
Creat: 0.78 mg/dL (ref 0.50–1.05)
Globulin: 2.8 g/dL (calc) (ref 1.9–3.7)
Glucose, Bld: 88 mg/dL (ref 65–99)
Potassium: 4.7 mmol/L (ref 3.5–5.3)
Sodium: 141 mmol/L (ref 135–146)
Total Bilirubin: 0.2 mg/dL (ref 0.2–1.2)
Total Protein: 7.3 g/dL (ref 6.1–8.1)
eGFR: 85 mL/min/{1.73_m2} (ref >=60)

## 2022-11-30 LAB — HEPATITIS C RNA QUANTITATIVE
HCV Quantitative Log: <1.18 log IU/mL
HCV RNA, PCR, QN: <15 IU/mL

## 2022-11-30 LAB — LIPID PANEL
Cholesterol: 252 mg/dL — ABNORMAL HIGH (ref <200)
HDL: 95 mg/dL (ref >=50)
LDL Cholesterol (Calc): 138 mg/dL (calc) — ABNORMAL HIGH
Non-HDL Cholesterol (Calc): 157 mg/dL (calc) — ABNORMAL HIGH (ref <130)
Total CHOL/HDL Ratio: 2.7 (calc) (ref <5.0)
Triglycerides: 87 mg/dL (ref <150)

## 2022-11-30 LAB — DRUG MONITORING, PANEL 8 WITH CONFIRMATION, URINE
6 Acetylmorphine: NEGATIVE ng/mL (ref <10)
Alcohol Metabolites: NEGATIVE ng/mL (ref <500)
Amphetamines: NEGATIVE ng/mL (ref <500)
Benzodiazepines: NEGATIVE ng/mL (ref <100)
Buprenorphine, Urine: NEGATIVE ng/mL (ref <5)
Cocaine Metabolite: NEGATIVE ng/mL (ref <150)
Creatinine: 65.9 mg/dL (ref >=20.0)
MDMA: NEGATIVE ng/mL (ref <500)
Marijuana Metabolite: NEGATIVE ng/mL (ref <20)
Opiates: NEGATIVE ng/mL (ref <100)
Oxidant: NEGATIVE ug/mL (ref <200)
Oxycodone: NEGATIVE ng/mL (ref <100)
pH: 5.4 (ref 4.5–9.0)

## 2022-11-30 LAB — CBC
HCT: 42 % (ref 35.0–45.0)
Hemoglobin: 13.9 g/dL (ref 11.7–15.5)
MCH: 27.6 pg (ref 27.0–33.0)
MCHC: 33.1 g/dL (ref 32.0–36.0)
MCV: 83.5 fL (ref 80.0–100.0)
MPV: 10.5 fL (ref 7.5–12.5)
Platelets: 313 10*3/uL (ref 140–400)
RBC: 5.03 10*6/uL (ref 3.80–5.10)
RDW: 12.9 % (ref 11.0–15.0)
WBC: 5.9 10*3/uL (ref 3.8–10.8)

## 2022-11-30 LAB — HEPATITIS C ANTIBODY: Hepatitis C Ab: REACTIVE — AB

## 2022-11-30 LAB — DM TEMPLATE

## 2022-11-30 LAB — HEPATITIS C GENOTYPE: HCV Genotype: NOT DETECTED

## 2022-11-30 LAB — HEMOGLOBIN A1C
Hgb A1c MFr Bld: 6 % of total Hgb — ABNORMAL HIGH (ref <5.7)
Mean Plasma Glucose: 126 mg/dL
eAG (mmol/L): 7 mmol/L

## 2022-12-01 MED ORDER — METRONIDAZOLE 500 MG PO TABS: 500.0000 mg | ORAL_TABLET | Freq: Two times a day (BID) | ORAL | 0 refills | Status: DC

## 2022-12-01 NOTE — Addendum Note (Signed)
Addended by: Jearld Fenton on: 12/01/2022 10:17 AM   Modules accepted: Orders

## 2022-12-12 ENCOUNTER — Encounter: Payer: Self-pay | Admitting: Internal Medicine

## 2022-12-12 ENCOUNTER — Ambulatory Visit
Admission: RE | Admit: 2022-12-12 | Discharge: 2022-12-12 | Disposition: A | Discharge status: Home / Self Care | Payer: 59 | Source: Home / Self Care | Attending: Internal Medicine | Admitting: Internal Medicine

## 2022-12-12 ENCOUNTER — Ambulatory Visit
Admission: RE | Admit: 2022-12-12 | Discharge: 2022-12-12 | Disposition: A | Discharge status: Home / Self Care | Payer: 59 | Source: Ambulatory Visit | Attending: Internal Medicine | Admitting: Internal Medicine

## 2022-12-12 ENCOUNTER — Ambulatory Visit (INDEPENDENT_AMBULATORY_CARE_PROVIDER_SITE_OTHER): Payer: 59 | Admitting: Internal Medicine

## 2022-12-12 VITALS — BP 138/82 | HR 90 | Temp 96.8°F | Wt 124.0 lb

## 2022-12-12 DIAGNOSIS — M25511 Pain in right shoulder: Secondary | ICD-10-CM

## 2022-12-12 DIAGNOSIS — G8929 Other chronic pain: Secondary | ICD-10-CM | POA: Insufficient documentation

## 2022-12-12 DIAGNOSIS — E7800 Pure hypercholesterolemia, unspecified: Secondary | ICD-10-CM | POA: Insufficient documentation

## 2022-12-12 DIAGNOSIS — J449 Chronic obstructive pulmonary disease, unspecified: Secondary | ICD-10-CM | POA: Diagnosis not present

## 2022-12-12 DIAGNOSIS — L2082 Flexural eczema: Secondary | ICD-10-CM | POA: Diagnosis not present

## 2022-12-12 DIAGNOSIS — L309 Dermatitis, unspecified: Secondary | ICD-10-CM | POA: Insufficient documentation

## 2022-12-12 DIAGNOSIS — R7303 Prediabetes: Secondary | ICD-10-CM | POA: Insufficient documentation

## 2022-12-12 MED ORDER — TRIAMCINOLONE ACETONIDE 0.025 % EX OINT: 1.0000 | TOPICAL_OINTMENT | Freq: Two times a day (BID) | CUTANEOUS | 0 refills | Status: DC

## 2022-12-12 MED ORDER — PROAIR HFA 108 (90 BASE) MCG/ACT IN AERS: INHALATION_SPRAY | RESPIRATORY_TRACT | 2 refills | Status: DC

## 2022-12-12 MED ORDER — LIDOCAINE 5 % EX PTCH: 1.0000 | MEDICATED_PATCH | CUTANEOUS | 2 refills | Status: DC

## 2022-12-12 MED ORDER — PREDNISONE 10 MG PO TABS: ORAL_TABLET | ORAL | 0 refills | Status: DC

## 2022-12-12 NOTE — Progress Notes (Signed)
Subjective:    Patient ID: Sarah Mcdonald, female    DOB: April 28, 1956, 67 y.o.   MRN: [id number]  HPI  Patient presents to clinic today with complaint of shoulder pain.  This started 1 year ago but has progressively gotten worse.  She describes the pain as stabbing. The pain radiates into her neck and down her arm. She is having some difficulty lifting her shoulder above her head, reports tingling and weakness but denies numbness. She has tried Naproxen with minimal relief of symptoms.   She also reports a rash.  She noticed this about 1 month ago. She reports the rash itches. It has spread lately. She has used Vaseline OTC with minimal relief of symptoms. She reports she recently started washing her face with water, and she has never really washed her face before.   She also like a refill on her albuterol Hailer.  She uses this as needed for cough and shortness of breath secondary to COPD.  Review of Systems     Past Medical History:  Diagnosis Date   ADHD    Allergy    Anxiety    Back pain    COPD (chronic obstructive pulmonary disease) (HCC)    Depression    Eczema    Fibromyalgia    GERD (gastroesophageal reflux disease)    History of hepatitis C     Current Outpatient Medications  Medication Sig Dispense Refill   metroNIDAZOLE (FLAGYL) 500 MG tablet Take 1 tablet (500 mg total) by mouth 2 (two) times daily. Do not drink alcohol while taking this medicine. 14 tablet 0   PROAIR HFA 108 (90 Base) MCG/ACT inhaler INHALE 2 PUFFS BY MOUTH EVERY 6 HOURS AS NEEDED FOR WHEEZING AND FOR SHORTNESS OF BREATH 8 g 6   No current facility-administered medications for this visit.    Allergies  Allergen Reactions   Seroquel [Quetiapine Fumarate] Other (See Comments)    Altered mental status   Amitriptyline     Family History  Problem Relation Age of Onset   Heart failure Mother    Emphysema Mother    Obesity Mother    Cancer Sister        Unknow (Maybe breast cancer)   Throat  cancer Sister    Pneumonia Sister    Cancer Brother    COPD Brother    Hepatitis C Brother     Social History   Socioeconomic History   Marital status: Divorced    Spouse name: Not on file   Number of children: 3   Years of education: some college   Highest education level: 12th grade  Occupational History   Occupation: Disabled  Tobacco Use   Smoking status: Former    Packs/day: 1.00    Years: 41.00    Total pack years: 41.00    Types: Cigarettes    Quit date: 09/14/2014    Years since quitting: 8.2   Smokeless tobacco: Never   Tobacco comments:    smoking cessation materials not required  Vaping Use   Vaping Use: Former   Start date: 09/19/2014   Quit date: 05/19/2017  Substance and Sexual Activity   Alcohol use: Yes    Alcohol/week: 6.0 standard drinks of alcohol    Types: 6 Cans of beer per week    Comment: 6 pack per week   Drug use: No   Sexual activity: Yes  Other Topics Concern   Not on file  Social History Narrative   Lives with long  time partner whom she calls her husband   Social Determinants of Health   Financial Resource Strain: Medium Risk (01/14/2020)   Overall Financial Resource Strain (CARDIA)    Difficulty of Paying Living Expenses: Somewhat hard  Food Insecurity: No Food Insecurity (01/14/2020)   Hunger Vital Sign    Worried About Running Out of Food in the Last Year: Never true    Gleed in the Last Year: Never true  Transportation Needs: No Transportation Needs (01/14/2020)   PRAPARE - Hydrologist (Medical): No    Lack of Transportation (Non-Medical): No  Physical Activity: Sufficiently Active (01/14/2020)   Exercise Vital Sign    Days of Exercise per Week: 7 days    Minutes of Exercise per Session: 30 min  Stress: No Stress Concern Present (01/14/2020)   [place]    Feeling of Stress : Only a little  Social Connections: Socially  Isolated (01/14/2020)   Social Connection and Isolation Panel [NHANES]    Frequency of Communication with Friends and Family: More than three times a week    Frequency of Social Gatherings with Friends and Family: Three times a week    Attends Religious Services: Never    Active Member of Clubs or Organizations: No    Attends Archivist Meetings: Never    Marital Status: Divorced  Human resources officer Violence: Not At Risk (01/14/2020)   Humiliation, Afraid, Rape, and Kick questionnaire    Fear of Current or Ex-Partner: No    Emotionally Abused: No    Physically Abused: No    Sexually Abused: No     Constitutional: Denies fever, malaise, fatigue, headache or abrupt weight changes.  Respiratory: Patient reports intermittent cough and shortness of breath.  Denies difficulty breathing, or sputum production.   Cardiovascular: Denies chest pain, chest tightness, palpitations or swelling in the hands or feet.  Gastrointestinal: Denies abdominal pain, bloating, constipation, diarrhea or blood in the stool.  Musculoskeletal: Patient reports shoulder pain, decrease in range of motion.  Denies difficulty with gait, muscle pain or joint swelling.  Skin: Patient reports rash of face.  Denies lesions or ulcercations.  Neurological: Patient reports weakness and tingling of right upper extremity.  Denies dizziness, difficulty with memory, difficulty with speech or problems with balance and coordination.    No other specific complaints in a complete review of systems (except as listed in HPI above).  Objective:   Physical Exam   BP 138/82 (BP Location: Right Arm, Patient Position: Sitting, Cuff Size: Normal)   Pulse 90   Temp (!) 96.8 F (36 C) (Temporal)   Wt 124 lb (56.2 kg)   SpO2 99%   BMI 23.43 kg/m   Wt Readings from Last 3 Encounters:  11/27/22 125 lb (56.7 kg)  01/14/20 137 lb 9.6 oz (62.4 kg)  11/17/19 138 lb (62.6 kg)    General: Appears her stated age, well developed,  well nourished in NAD. Skin: Red, scaly rash noted at the corners of bilateral nares. Cardiovascular: Normal rate and rhythm.  Pulmonary/Chest: Normal effort and positive vesicular breath sounds. No respiratory distress. No wheezes, rales or ronchi noted.  Musculoskeletal: Decreased external and internal rotation of the right shoulder.  Pain with palpation over the right subacromial bursa, anterior biceps tendon and posterior triceps tendon.  Negative drop can test on the right.  Strength 5/5 BUE.  Handgrips equal.  No difficulty with gait.  Neurological: Alert  and oriented. Coordination normal.      BMET    Component Value Date/Time   NA 141 11/27/2022 1103   K 4.7 11/27/2022 1103   CL 107 11/27/2022 1103   CO2 24 11/27/2022 1103   GLUCOSE 88 11/27/2022 1103   BUN 16 11/27/2022 1103   CREATININE 0.78 11/27/2022 1103   CALCIUM 9.2 11/27/2022 1103    Lipid Panel     Component Value Date/Time   CHOL 252 (H) 11/27/2022 1103   TRIG 87 11/27/2022 1103   HDL 95 11/27/2022 1103   CHOLHDL 2.7 11/27/2022 1103   LDLCALC 138 (H) 11/27/2022 1103    CBC    Component Value Date/Time   WBC 5.9 11/27/2022 1103   RBC 5.03 11/27/2022 1103   HGB 13.9 11/27/2022 1103   HCT 42.0 11/27/2022 1103   PLT 313 11/27/2022 1103   MCV 83.5 11/27/2022 1103   MCH 27.6 11/27/2022 1103   MCHC 33.1 11/27/2022 1103   RDW 12.9 11/27/2022 1103    Hgb A1C Lab Results  Component Value Date   HGBA1C 6.0 (H) 11/27/2022           Assessment & Plan:     RTC in 5 months for annual exam Webb Silversmith, NP

## 2022-12-12 NOTE — Assessment & Plan Note (Signed)
X-ray right shoulder today Rx for Pred taper x 9 days Rx for Lidoderm patches Consider referral to PT versus orthopedics pending x-ray results

## 2022-12-12 NOTE — Assessment & Plan Note (Signed)
Albuterol inhaler refilled today

## 2022-12-12 NOTE — Assessment & Plan Note (Signed)
Rx from triamcinolone cream twice daily as needed

## 2022-12-12 NOTE — Patient Instructions (Signed)

## 2022-12-14 ENCOUNTER — Other Ambulatory Visit: Payer: Self-pay | Admitting: Internal Medicine

## 2022-12-14 MED ORDER — ALBUTEROL SULFATE HFA 108 (90 BASE) MCG/ACT IN AERS: 2.0000 | INHALATION_SPRAY | Freq: Four times a day (QID) | RESPIRATORY_TRACT | 2 refills | Status: DC | PRN

## 2023-01-10 ENCOUNTER — Encounter: Payer: Self-pay | Admitting: Internal Medicine

## 2023-01-11 NOTE — Telephone Encounter (Signed)
Was a prior Auth needed on the Lidoderm patches?

## 2023-02-02 ENCOUNTER — Ambulatory Visit: Payer: 59 | Admitting: Family Medicine

## 2023-02-06 ENCOUNTER — Other Ambulatory Visit (HOSPITAL_COMMUNITY)
Admission: RE | Admit: 2023-02-06 | Discharge: 2023-02-06 | Disposition: A | Discharge status: Home / Self Care | Payer: 59 | Source: Ambulatory Visit | Attending: Internal Medicine | Admitting: Internal Medicine

## 2023-02-06 ENCOUNTER — Ambulatory Visit (INDEPENDENT_AMBULATORY_CARE_PROVIDER_SITE_OTHER): Payer: 59 | Admitting: Internal Medicine

## 2023-02-06 VITALS — BP 118/68 | HR 66 | Temp 95.9°F | Wt 127.0 lb

## 2023-02-06 DIAGNOSIS — N898 Other specified noninflammatory disorders of vagina: Secondary | ICD-10-CM | POA: Diagnosis present

## 2023-02-06 DIAGNOSIS — M25511 Pain in right shoulder: Secondary | ICD-10-CM | POA: Diagnosis not present

## 2023-02-06 DIAGNOSIS — G8929 Other chronic pain: Secondary | ICD-10-CM

## 2023-02-06 DIAGNOSIS — K219 Gastro-esophageal reflux disease without esophagitis: Secondary | ICD-10-CM

## 2023-02-06 DIAGNOSIS — I700 Atherosclerosis of aorta: Secondary | ICD-10-CM | POA: Insufficient documentation

## 2023-02-06 MED ORDER — OMEPRAZOLE 20 MG PO CPDR: 20.0000 mg | DELAYED_RELEASE_CAPSULE | Freq: Every day | ORAL | 0 refills | Status: DC

## 2023-02-06 MED ORDER — LIDOCAINE 5 % EX PTCH: 1.0000 | MEDICATED_PATCH | CUTANEOUS | 2 refills | Status: DC

## 2023-02-06 NOTE — Progress Notes (Signed)
Subjective:    Patient ID: Sarah DeutscherLoren Blackley, female    DOB: 03/13/1956, 67 y.o.   MRN: [id number]  HPI  Patient presents to clinic today with complaint of epigastric pain related to reflux.  She reports this is an intermittent issue but has been worse in the last 3 weeks. She reports associated nausea and constipation. She denies vomiting, diarrhea or blood in her stool. She denies fever, chills. She denies recent change in diet or medications. She has tried Tums OTC with some relief of symptoms. She has a history of GERD and hepatitis C.  She is having some vaginal irritation but denies discharge, odor, abnormal bleeding or urinary symptoms.  She has had a hysterectomy.  She is sexually active in a monogamous relationship.  She was treated for BV 10/2022 but is unsure if her symptoms have resolved.  She also reports persistent right shoulder pain.  She reports the steroids helped but once they wore off the pain is back.  She would like her Lidoderm patches refilled.  She would also like a referral to orthopedics at this time.  Review of Systems     Past Medical History:  Diagnosis Date   ADHD    Allergy    Anxiety    Back pain    COPD (chronic obstructive pulmonary disease) (HCC)    Depression    Eczema    Fibromyalgia    GERD (gastroesophageal reflux disease)    History of hepatitis C     Current Outpatient Medications  Medication Sig Dispense Refill   albuterol (VENTOLIN HFA) 108 (90 Base) MCG/ACT inhaler Inhale 2 puffs into the lungs every 6 (six) hours as needed for wheezing or shortness of breath. 8 g 2   lidocaine (LIDODERM) 5 % Place 1 patch onto the skin daily. Remove & Discard patch within 12 hours or as directed by MD 30 patch 2   metroNIDAZOLE (FLAGYL) 500 MG tablet Take 1 tablet (500 mg total) by mouth 2 (two) times daily. Do not drink alcohol while taking this medicine. 14 tablet 0   predniSONE (DELTASONE) 10 MG tablet Take 3 tabs on days 1-3, 2 tabs on days 4-6, 1 tab on  days 7-9 18 tablet 0   triamcinolone (KENALOG) 0.025 % ointment Apply 1 Application topically 2 (two) times daily. 30 g 0   No current facility-administered medications for this visit.    Allergies  Allergen Reactions   Seroquel [Quetiapine Fumarate] Other (See Comments)    Altered mental status   Amitriptyline     Family History  Problem Relation Age of Onset   Heart failure Mother    Emphysema Mother    Obesity Mother    Cancer Sister        Unknow (Maybe breast cancer)   Throat cancer Sister    Pneumonia Sister    Cancer Brother    COPD Brother    Hepatitis C Brother     Social History   Socioeconomic History   Marital status: Divorced    Spouse name: Not on file   Number of children: 3   Years of education: some college   Highest education level: 12th grade  Occupational History   Occupation: Disabled  Tobacco Use   Smoking status: Former    Packs/day: 1.00    Years: 41.00    Additional pack years: 0.00    Total pack years: 41.00    Types: Cigarettes    Quit date: 09/14/2014    Years since  quitting: 8.4   Smokeless tobacco: Never   Tobacco comments:    smoking cessation materials not required  Vaping Use   Vaping Use: Former   Start date: 09/19/2014   Quit date: 05/19/2017  Substance and Sexual Activity   Alcohol use: Yes    Alcohol/week: 6.0 standard drinks of alcohol    Types: 6 Cans of beer per week    Comment: 6 pack per week   Drug use: No   Sexual activity: Yes  Other Topics Concern   Not on file  Social History Narrative   Lives with long time partner whom she calls her husband   Social Determinants of Health   Financial Resource Strain: Medium Risk (01/14/2020)   Overall Financial Resource Strain (CARDIA)    Difficulty of Paying Living Expenses: Somewhat hard  Food Insecurity: No Food Insecurity (01/14/2020)   Hunger Vital Sign    Worried About Running Out of Food in the Last Year: Never true    Ran Out of Food in the Last Year: Never  true  Transportation Needs: No Transportation Needs (01/14/2020)   PRAPARE - Administrator, Civil Service (Medical): No    Lack of Transportation (Non-Medical): No  Physical Activity: Sufficiently Active (01/14/2020)   Exercise Vital Sign    Days of Exercise per Week: 7 days    Minutes of Exercise per Session: 30 min  Stress: No Stress Concern Present (01/14/2020)   [employer] of Occupational Health - Occupational Stress Questionnaire    Feeling of Stress : Only a little  Social Connections: Socially Isolated (01/14/2020)   Social Connection and Isolation Panel [NHANES]    Frequency of Communication with Friends and Family: More than three times a week    Frequency of Social Gatherings with Friends and Family: Three times a week    Attends Religious Services: Never    Active Member of Clubs or Organizations: No    Attends Banker Meetings: Never    Marital Status: Divorced  Catering manager Violence: Not At Risk (01/14/2020)   Humiliation, Afraid, Rape, and Kick questionnaire    Fear of Current or Ex-Partner: No    Emotionally Abused: No    Physically Abused: No    Sexually Abused: No     Constitutional: Denies fever, malaise, fatigue, headache or abrupt weight changes.  Respiratory: Denies difficulty breathing, shortness of breath, cough or sputum production.   Cardiovascular: Denies chest pain, chest tightness, palpitations or swelling in the hands or feet.  Gastrointestinal: Patient reports abdominal pain, nausea and constipation.  Denies bloating,  diarrhea or blood in the stool.  GU: Patient reports vaginal irritation.  Denies urgency, frequency, pain with urination, burning sensation, blood in urine, odor or discharge. Musculoskeletal: Patient reports chronic joint and muscle pain.  Denies decrease in range of motion, difficulty with gait, or joint swelling.  Skin: Denies redness, rashes, lesions or ulcercations.    No other specific complaints  in a complete review of systems (except as listed in HPI above).  Objective:   Physical Exam  BP 118/68 (BP Location: Right Arm, Patient Position: Sitting, Cuff Size: Normal)   Pulse 66   Temp (!) 95.9 F (35.5 C) (Temporal)   Wt 127 lb (57.6 kg)   SpO2 100%   BMI 24.00 kg/m   Wt Readings from Last 3 Encounters:  12/12/22 124 lb (56.2 kg)  11/27/22 125 lb (56.7 kg)  01/14/20 137 lb 9.6 oz (62.4 kg)    General: Appears  her stated age, well developed, well nourished in NAD. Skin: Warm, dry and intact.  HEENT: Head: normal shape and size; Eyes: sclera white, no icterus, conjunctiva pink, PERRLA and EOMs intact;  Cardiovascular: Normal rate and rhythm. S1,S2 noted.  No murmur, rubs or gallops noted.  Pulmonary/Chest: Normal effort and positive vesicular breath sounds. No respiratory distress. No wheezes, rales or ronchi noted.  Abdomen: Soft and tender in the epigastric region. Normal bowel sounds. No distention or masses noted.  Pelvic: Self swab. Musculoskeletal: Normal internal and external rotation of the right shoulder.  Pain with palpation of the right anterior biceps tendon. negative drop can test on the right.  Strength 5/5 BUE.  Handgrips equal. Neurological: Alert and oriented.  Coordination normal.     BMET    Component Value Date/Time   NA 141 11/27/2022 1103   K 4.7 11/27/2022 1103   CL 107 11/27/2022 1103   CO2 24 11/27/2022 1103   GLUCOSE 88 11/27/2022 1103   BUN 16 11/27/2022 1103   CREATININE 0.78 11/27/2022 1103   CALCIUM 9.2 11/27/2022 1103    Lipid Panel     Component Value Date/Time   CHOL 252 (H) 11/27/2022 1103   TRIG 87 11/27/2022 1103   HDL 95 11/27/2022 1103   CHOLHDL 2.7 11/27/2022 1103   LDLCALC 138 (H) 11/27/2022 1103    CBC    Component Value Date/Time   WBC 5.9 11/27/2022 1103   RBC 5.03 11/27/2022 1103   HGB 13.9 11/27/2022 1103   HCT 42.0 11/27/2022 1103   PLT 313 11/27/2022 1103   MCV 83.5 11/27/2022 1103   MCH 27.6  11/27/2022 1103   MCHC 33.1 11/27/2022 1103   RDW 12.9 11/27/2022 1103    Hgb A1C Lab Results  Component Value Date   HGBA1C 6.0 (H) 11/27/2022            Assessment & Plan:   Vaginal Irritation:  Advised her to avoid douching or bubble baths Will obtain wet prep  Chronic Right Shoulder Pain:  Referral to orthopedics for further evaluation and treatment is appropriate at this time Lidoderm patches refill  RTC in 3 months for your annual exam Nicki Reaper, NP

## 2023-02-06 NOTE — Patient Instructions (Signed)

## 2023-02-06 NOTE — Assessment & Plan Note (Signed)
Avoid tomato-based foods as this seems to be triggering your symptoms Will start omeprazole 20 mg daily

## 2023-02-08 ENCOUNTER — Telehealth: Payer: Self-pay

## 2023-02-08 LAB — CERVICOVAGINAL ANCILLARY ONLY
Bacterial Vaginitis (gardnerella): POSITIVE — AB
Candida Glabrata: NEGATIVE
Candida Vaginitis: NEGATIVE
Comment: NEGATIVE
Comment: NEGATIVE
Comment: NEGATIVE

## 2023-02-08 MED ORDER — METRONIDAZOLE 0.75 % VA GEL: 1.0000 | Freq: Two times a day (BID) | VAGINAL | 0 refills | Status: DC

## 2023-02-08 NOTE — Addendum Note (Signed)
Addended by: Lorre Munroe on: 02/08/2023 02:09 PM   Modules accepted: Orders

## 2023-02-08 NOTE — Telephone Encounter (Signed)
MetroGel sent to pharmacy

## 2023-02-08 NOTE — Telephone Encounter (Signed)
Copied from CRM [id number]. Topic: General - Other >> Feb 08, 2023  1:20 PM Pincus Sanes wrote: Reason for CRM: Pt states she was called this am re what type of form of the medication she was prescribed and states prefers suppositories.Pt unsure of name of medication.  Did not see a call notating... Fu [phone]

## 2023-02-13 NOTE — Telephone Encounter (Signed)
Is she requesting a prior Auth on Lidoderm patches?  I am not exactly sure what she means to fax her insurance company.

## 2023-03-27 ENCOUNTER — Other Ambulatory Visit: Payer: Self-pay | Admitting: Internal Medicine

## 2023-03-28 NOTE — Telephone Encounter (Signed)
Requested medication (s) are due for refill today - unsure  Requested medication (s) are on the active medication list -yes  Future visit scheduled -yes  Last refill: 02/08/23 70g  Notes to clinic: off protocol- provider review   Requested Prescriptions  Pending Prescriptions Disp Refills   metroNIDAZOLE (METROGEL) 0.75 % vaginal gel [Pharmacy Med Name: METRONIDAZOLE 0.75% VAGINAL GEL 70G] 70 g 0    Sig: INSERT 1 APPLICATORFUL VAGINALLY TWICE DAILY AS DIRECTED     Off-Protocol Failed - 03/27/2023  2:24 PM      Failed - Medication not assigned to a protocol, review manually.      Passed - Valid encounter within last 12 months    Recent Outpatient Visits           1 month ago Vaginal irritation   [place] [place], [place] W, NP   3 months ago Chronic right shoulder pain   [place] [facility] [place], Salvadore Oxford, NP   4 months ago History of hepatitis C   [place] [facility] [place], [place] W, NP   3 years ago Gastroesophageal reflux disease, unspecified whether esophagitis present   [facility], MD   4 years ago Flexural eczema   [place] [facility], MD       Future Appointments             In 1 month Baity, Salvadore Oxford, NP [place] [facility]               Requested Prescriptions  Pending Prescriptions Disp Refills   metroNIDAZOLE (METROGEL) 0.75 % vaginal gel [Pharmacy Med Name: METRONIDAZOLE 0.75% VAGINAL GEL 70G] 70 g 0    Sig: INSERT 1 APPLICATORFUL VAGINALLY TWICE DAILY AS DIRECTED     Off-Protocol Failed - 03/27/2023  2:24 PM      Failed - Medication not assigned to a protocol, review manually.      Passed - Valid encounter within last 12 months    Recent Outpatient Visits           1 month ago Vaginal irritation   [facility] [place], [place] W, NP   3 months ago Chronic right shoulder pain   [place] [facility] [place], Salvadore Oxford, NP   4 months ago History of hepatitis C   [place] [facility] [place], Salvadore Oxford, NP   3 years ago Gastroesophageal reflux disease, unspecified whether esophagitis present   [facility], MD   4 years ago Flexural eczema   [place] [facility], MD       Future Appointments             In 1 month Baity, Salvadore Oxford, NP  [facility], [facility]

## 2023-04-19 ENCOUNTER — Ambulatory Visit (INDEPENDENT_AMBULATORY_CARE_PROVIDER_SITE_OTHER): Payer: 59

## 2023-04-19 VITALS — Ht 61.0 in | Wt 130.0 lb

## 2023-04-19 DIAGNOSIS — Z0000 Encounter for general adult medical examination without abnormal findings: Secondary | ICD-10-CM

## 2023-04-19 NOTE — Progress Notes (Signed)
Subjective:   Sarah Mcdonald is a 67 y.o. female who presents for Medicare Annual (Subsequent) preventive examination.  Visit Complete: Virtual  I connected with  Clide Deutscher on 04/19/23 by a audio enabled telemedicine application and verified that I am speaking with the correct person using two identifiers.  Patient Location: Home  Provider Location: Office/Clinic  I discussed the limitations of evaluation and management by telemedicine. The patient expressed understanding and agreed to proceed.    Review of Systems     Cardiac Risk Factors include: none     Objective:    Today's Vitals   04/19/23 1157 04/19/23 1158  Weight: 130 lb (59 kg)   Height: 5\' 1"  (1.549 m)   PainSc:  4    Body mass index is 24.56 kg/m.     04/19/2023   12:04 PM 01/14/2020    1:37 PM 01/13/2019    1:56 PM 01/09/2018   11:42 AM 12/15/2017   11:54 AM  Advanced Directives  Does Patient Have a Medical Advance Directive? Yes Yes Yes No No  Type of Advance Directive Living will Healthcare Power of [place];Living will Living will;Healthcare Power of Attorney    Does patient want to make changes to medical advance directive?   Yes (MAU/Ambulatory/Procedural Areas - Information given)    Copy of Healthcare Power of Attorney in Chart?  No - copy requested No - copy requested    Would patient like information on creating a medical advance directive?    Yes (MAU/Ambulatory/Procedural Areas - Information given)     Current Medications (verified) Outpatient Encounter Medications as of 04/19/2023  Medication Sig   albuterol (VENTOLIN HFA) 108 (90 Base) MCG/ACT inhaler Inhale 2 puffs into the lungs every 6 (six) hours as needed for wheezing or shortness of breath.   lidocaine (LIDODERM) 5 % Place 1 patch onto the skin daily. Remove & Discard patch within 12 hours or as directed by MD   metroNIDAZOLE (METROGEL) 0.75 % vaginal gel Place 1 Applicatorful vaginally 2 (two) times daily. (Patient not taking:  Reported on 04/19/2023)   omeprazole (PRILOSEC) 20 MG capsule Take 1 capsule (20 mg total) by mouth daily.   triamcinolone (KENALOG) 0.025 % ointment Apply 1 Application topically 2 (two) times daily.   No facility-administered encounter medications on file as of 04/19/2023.    Allergies (verified) Seroquel [quetiapine fumarate] and Amitriptyline   History: Past Medical History:  Diagnosis Date   ADHD    Allergy    Anxiety    Back pain    COPD (chronic obstructive pulmonary disease) (HCC)    Depression    Eczema    Fibromyalgia    GERD (gastroesophageal reflux disease)    History of hepatitis C    Past Surgical History:  Procedure Laterality Date   ABDOMINAL HYSTERECTOMY     BILATERAL CARPAL TUNNEL RELEASE     CYST REMOVAL HAND     ELBOW SURGERY     KNEE SURGERY     LIVER BIOPSY     MOUTH SURGERY     NOSE SURGERY     deviated septal repair   OVARIAN CYST REMOVAL     TONSILLECTOMY     TUBAL LIGATION     Family History  Problem Relation Age of Onset   Heart failure Mother    Emphysema Mother    Obesity Mother    Cancer Sister        Unknow (Maybe breast cancer)   Throat cancer Sister  Pneumonia Sister    Cancer Brother    COPD Brother    Hepatitis C Brother    Social History   Socioeconomic History   Marital status: Divorced    Spouse name: Not on file   Number of children: 3   Years of education: some college   Highest education level: Some college, no degree  Occupational History   Occupation: Disabled  Tobacco Use   Smoking status: Former    Packs/day: 1.00    Years: 41.00    Additional pack years: 0.00    Total pack years: 41.00    Types: Cigarettes    Quit date: 09/14/2014    Years since quitting: 8.6   Smokeless tobacco: Never   Tobacco comments:    smoking cessation materials not required  Vaping Use   Vaping Use: Former   Start date: 09/19/2014   Quit date: 05/19/2017  Substance and Sexual Activity   Alcohol use: Yes     Alcohol/week: 6.0 standard drinks of alcohol    Types: 6 Cans of beer per week    Comment: 6 pack per week   Drug use: No   Sexual activity: Yes  Other Topics Concern   Not on file  Social History Narrative   Lives with long time partner whom she calls her husband   Social Determinants of Health   Financial Resource Strain: Low Risk  (04/19/2023)   Overall Financial Resource Strain (CARDIA)    Difficulty of Paying Living Expenses: Not hard at all  Recent Concern: Financial Resource Strain - Medium Risk (02/06/2023)   Overall Financial Resource Strain (CARDIA)    Difficulty of Paying Living Expenses: Somewhat hard  Food Insecurity: No Food Insecurity (04/19/2023)   Hunger Vital Sign    Worried About Running Out of Food in the Last Year: Never true    Ran Out of Food in the Last Year: Never true  Recent Concern: Food Insecurity - Food Insecurity Present (02/06/2023)   Hunger Vital Sign    Worried About Running Out of Food in the Last Year: Sometimes true    Ran Out of Food in the Last Year: Sometimes true  Transportation Needs: No Transportation Needs (04/19/2023)   PRAPARE - Administrator, Civil Service (Medical): No    Lack of Transportation (Non-Medical): No  Recent Concern: Transportation Needs - Unmet Transportation Needs (02/06/2023)   PRAPARE - Transportation    Lack of Transportation (Medical): Yes    Lack of Transportation (Non-Medical): Yes  Physical Activity: Insufficiently Active (04/19/2023)   Exercise Vital Sign    Days of Exercise per Week: 3 days    Minutes of Exercise per Session: 30 min  Stress: Stress Concern Present (04/19/2023)   [employer] of Occupational Health - Occupational Stress Questionnaire    Feeling of Stress : To some extent  Social Connections: Patient Declined (04/19/2023)   Social Connection and Isolation Panel [NHANES]    Frequency of Communication with Friends and Family: Patient declined    Frequency of Social Gatherings with  Friends and Family: Patient declined    Attends Religious Services: Patient declined    Database administrator or Organizations: Patient declined    Attends Banker Meetings: Patient declined    Marital Status: Patient declined  Recent Concern: Social Connections - Moderately Isolated (02/06/2023)   Social Connection and Isolation Panel [NHANES]    Frequency of Communication with Friends and Family: More than three times a week  Frequency of Social Gatherings with Friends and Family: Never    Attends Religious Services: 1 to 4 times per year    Active Member of [employer] or Organizations: No    Attends Engineer, structural: Not on file    Marital Status: Divorced    Tobacco Counseling Counseling given: Not Answered Tobacco comments: smoking cessation materials not required   Clinical Intake:  Pre-visit preparation completed: Yes  Pain : 0-10 Pain Score: 4  Pain Type: Chronic pain Pain Location: Shoulder Pain Orientation: Left, Right Pain Descriptors / Indicators: Aching Pain Onset: More than a month ago Pain Frequency: Constant     Nutritional Status: BMI of 19-24  Normal Nutritional Risks: Nausea/ vomitting/ diarrhea (last week had NVD, has resolved) Diabetes: No  How often do you need to have someone help you when you read instructions, pamphlets, or other written materials from your doctor or pharmacy?: 1 - Never  Interpreter Needed?: No  Information entered by :: NAllen LPN   Activities of Daily Living    04/19/2023   11:59 AM 02/06/2023    9:14 AM  In your present state of health, do you have any difficulty performing the following activities:  Hearing? 0 0  Vision? 0 1  Difficulty concentrating or making decisions? 0 1  Walking or climbing stairs? 0 1  Dressing or bathing? 0 1  Doing errands, shopping? 0 0  Preparing Food and eating ? N   Using the Toilet? N   In the past six months, have you accidently leaked urine? Y   Do you have  problems with loss of bowel control? Y   Managing your Medications? N   Managing your Finances? N   Housekeeping or managing your Housekeeping? N     Patient Care Team: Lorre Munroe, NP as PCP - General (Internal Medicine) Midge Minium, MD as Consulting Physician (Gastroenterology) Erin Fulling, MD as Consulting Physician (Pulmonary Disease)  Indicate any recent Medical Services you may have received from other than [facility] providers in the past year (date may be approximate).     Assessment:   This is a routine wellness examination for Radhika.  Hearing/Vision screen Vision Screening - Comments:: No regular eye exams  Dietary issues and exercise activities discussed:     Goals Addressed             This Visit's Progress    Patient Stated       04/19/2023, wants to lose weight       Depression Screen    04/19/2023   12:07 PM 02/06/2023    9:14 AM 12/12/2022    1:52 PM 01/14/2020    1:36 PM 11/17/2019    3:14 PM 01/13/2019    1:57 PM 01/09/2018   11:39 AM  PHQ 2/9 Scores  PHQ - 2 Score 0 2 0 0 0 2 2  PHQ- 9 Score  14 0  2 11 16     Fall Risk    04/19/2023   12:05 PM 02/06/2023    9:14 AM 01/14/2020    1:38 PM 01/13/2019    1:57 PM 01/09/2018   11:48 AM  Fall Risk   Falls in the past year? 1 1 0 0 No  Comment vertigo      Number falls in past yr: 0 1 0 0   Injury with Fall? 0 0 0 0   Risk for fall due to : Medication side effect Other (Comment)   History of fall(s);Impaired balance/gait;Impaired vision  Risk for fall due to: Comment     vertigo; wears eyeglasses  Follow up Falls prevention discussed;Education provided;Falls evaluation completed  Falls prevention discussed      MEDICARE RISK AT HOME:  Medicare Risk at Home - 04/19/23 1206     Any stairs in or around the home? No    If so, are there any without handrails? No    Home free of loose throw rugs in walkways, pet beds, electrical cords, etc? Yes    Adequate lighting in your home to reduce risk of falls?  Yes    Life alert? No    Use of a cane, walker or w/c? No    Grab bars in the bathroom? No    Shower chair or bench in shower? No    Elevated toilet seat or a handicapped toilet? No             TIMED UP AND GO:  Was the test performed?  No    Cognitive Function:        04/19/2023   12:07 PM 01/13/2019    2:03 PM 01/09/2018   11:50 AM  6CIT Screen  What Year? 0 points 0 points 0 points  What month? 0 points 0 points 0 points  What time? 0 points 0 points 0 points  Count back from 20 0 points 0 points 0 points  Months in reverse 0 points 0 points 0 points  Repeat phrase 0 points 0 points 0 points  Total Score 0 points 0 points 0 points    Immunizations Immunization History  Administered Date(s) Administered   Influenza,inj,Quad PF,6+ Mos 08/30/2017, 08/20/2018   Influenza-Unspecified 07/30/2017   Tdap 03/30/2010    TDAP status: Due, Education has been provided regarding the importance of this vaccine. Advised may receive this vaccine at local pharmacy or Health Dept. Aware to provide a copy of the vaccination record if obtained from local pharmacy or Health Dept. Verbalized acceptance and understanding.  Flu Vaccine status: Declined, Education has been provided regarding the importance of this vaccine but patient still declined. Advised may receive this vaccine at local pharmacy or Health Dept. Aware to provide a copy of the vaccination record if obtained from local pharmacy or Health Dept. Verbalized acceptance and understanding.  Pneumococcal vaccine status: Declined,  Education has been provided regarding the importance of this vaccine but patient still declined. Advised may receive this vaccine at local pharmacy or Health Dept. Aware to provide a copy of the vaccination record if obtained from local pharmacy or Health Dept. Verbalized acceptance and understanding.   Covid-19 vaccine status: Declined, Education has been provided regarding the importance of this vaccine  but patient still declined. Advised may receive this vaccine at local pharmacy or Health Dept.or vaccine clinic. Aware to provide a copy of the vaccination record if obtained from local pharmacy or Health Dept. Verbalized acceptance and understanding.  Qualifies for Shingles Vaccine? Yes   Zostavax completed No   Shingrix Completed?: No.    Education has been provided regarding the importance of this vaccine. Patient has been advised to call insurance company to determine out of pocket expense if they have not yet received this vaccine. Advised may also receive vaccine at local pharmacy or Health Dept. Verbalized acceptance and understanding.  Screening Tests Health Maintenance  Topic Date Due   COVID-19 Vaccine (1) Never done   Zoster Vaccines- Shingrix (1 of 2) Never done   PAP SMEAR-Modifier  10/30/2010   MAMMOGRAM  10/30/2016  Lung Cancer Screening  03/20/2020   DTaP/Tdap/Td (2 - Td or Tdap) 03/30/2020   Medicare Annual Wellness (AWV)  01/13/2021   Colonoscopy  10/30/2021   INFLUENZA VACCINE  05/31/2023   Hepatitis C Screening  Completed   HIV Screening  Completed   HPV VACCINES  Aged Out    Health Maintenance  Health Maintenance Due  Topic Date Due   COVID-19 Vaccine (1) Never done   Zoster Vaccines- Shingrix (1 of 2) Never done   PAP SMEAR-Modifier  10/30/2010   MAMMOGRAM  10/30/2016   Lung Cancer Screening  03/20/2020   DTaP/Tdap/Td (2 - Td or Tdap) 03/30/2020   Medicare Annual Wellness (AWV)  01/13/2021   Colonoscopy  10/30/2021    Colorectal cancer screening: due   Mammogram status: decline  Bone Density status: due  Lung Cancer Screening: (Low Dose CT Chest recommended if Age 32-80 years, 20 pack-year currently smoking OR have quit w/in 15years.) does qualify.   Lung Cancer Screening Referral: discussed  Additional Screening:  Hepatitis C Screening: does qualify; Completed 11/27/2022  Vision Screening: Recommended annual ophthalmology exams for early  detection of glaucoma and other disorders of the eye. Is the patient up to date with their annual eye exam?  No  Who is the provider or what is the name of the office in which the patient attends annual eye exams? none If pt is not established with a provider, would they like to be referred to a provider to establish care? No .   Dental Screening: Recommended annual dental exams for proper oral hygiene  Diabetic Foot Exam: n/a  Community Resource Referral / Chronic Care Management: CRR required this visit?  No   CCM required this visit?  No     Plan:     I have personally reviewed and noted the following in the patient's chart:   Medical and social history Use of alcohol, tobacco or illicit drugs  Current medications and supplements including opioid prescriptions. Patient is not currently taking opioid prescriptions. Functional ability and status Nutritional status Physical activity Advanced directives List of other physicians Hospitalizations, surgeries, and ER visits in previous 12 months Vitals Screenings to include cognitive, depression, and falls Referrals and appointments  In addition, I have reviewed and discussed with patient certain preventive protocols, quality metrics, and best practice recommendations. A written personalized care plan for preventive services as well as general preventive health recommendations were provided to patient.     Barb Merino, LPN   1/61/0960   After Visit Summary: (MyChart) Due to this being a telephonic visit, the after visit summary with patients personalized plan was offered to patient via MyChart   Nurse Notes: none

## 2023-04-19 NOTE — Patient Instructions (Signed)
Sarah Mcdonald , Thank you for taking time to come for your Medicare Wellness Visit. I appreciate your ongoing commitment to your health goals. Please review the following plan we discussed and let me know if I can assist you in the future.   These are the goals we discussed:  Goals      DIET - INCREASE WATER INTAKE     Recommend to drink at least 6-8 8oz glasses of water per day.     Patient Stated     04/19/2023, wants to lose weight        This is a list of the screening recommended for you and due dates:  Health Maintenance  Topic Date Due   COVID-19 Vaccine (1) Never done   Zoster (Shingles) Vaccine (1 of 2) Never done   Pap Smear  10/30/2010   Mammogram  10/30/2016   Screening for Lung Cancer  03/20/2020   DTaP/Tdap/Td vaccine (2 - Td or Tdap) 03/30/2020   Colon Cancer Screening  10/30/2021   Flu Shot  05/31/2023   Medicare Annual Wellness Visit  04/18/2024   Hepatitis C Screening  Completed   HIV Screening  Completed   HPV Vaccine  Aged Out    Advanced directives: Please bring a copy of your POA (Power of [place]) and/or Living Will to your next appointment.   Conditions/risks identified: none  Next appointment: Follow up in one year for your annual wellness visit.   Preventive Care 40-64 Years, Female Preventive care refers to lifestyle choices and visits with your health care provider that can promote health and wellness. What does preventive care include? A yearly physical exam. This is also called an annual well check. Dental exams once or twice a year. Routine eye exams. Ask your health care provider how often you should have your eyes checked. Personal lifestyle choices, including: Daily care of your teeth and gums. Regular physical activity. Eating a healthy diet. Avoiding tobacco and drug use. Limiting alcohol use. Practicing safe sex. Taking low-dose aspirin daily starting at age 33. Taking vitamin and mineral supplements as recommended by your health  care provider. What happens during an annual well check? The services and screenings done by your health care provider during your annual well check will depend on your age, overall health, lifestyle risk factors, and family history of disease. Counseling  Your health care provider may ask you questions about your: Alcohol use. Tobacco use. Drug use. Emotional well-being. Home and relationship well-being. Sexual activity. Eating habits. Work and work Astronomer. Method of birth control. Menstrual cycle. Pregnancy history. Screening  You may have the following tests or measurements: Height, weight, and BMI. Blood pressure. Lipid and cholesterol levels. These may be checked every 5 years, or more frequently if you are over 6 years old. Skin check. Lung cancer screening. You may have this screening every year starting at age 91 if you have a 30-pack-year history of smoking and currently smoke or have quit within the past 15 years. Fecal occult blood test (FOBT) of the stool. You may have this test every year starting at age 38. Flexible sigmoidoscopy or colonoscopy. You may have a sigmoidoscopy every 5 years or a colonoscopy every 10 years starting at age 74. Hepatitis C blood test. Hepatitis B blood test. Sexually transmitted disease (STD) testing. Diabetes screening. This is done by checking your blood sugar (glucose) after you have not eaten for a while (fasting). You may have this done every 1-3 years. Mammogram. This may be done  every 1-2 years. Talk to your health care provider about when you should start having regular mammograms. This may depend on whether you have a family history of breast cancer. BRCA-related cancer screening. This may be done if you have a family history of breast, ovarian, tubal, or peritoneal cancers. Pelvic exam and Pap test. This may be done every 3 years starting at age 21. Starting at age 30, this may be done every 5 years if you have a Pap test in  combination with an HPV test. Bone density scan. This is done to screen for osteoporosis. You may have this scan if you are at high risk for osteoporosis. Discuss your test results, treatment options, and if necessary, the need for more tests with your health care provider. Vaccines  Your health care provider may recommend certain vaccines, such as: Influenza vaccine. This is recommended every year. Tetanus, diphtheria, and acellular pertussis (Tdap, Td) vaccine. You may need a Td booster every 10 years. Zoster vaccine. You may need this after age 60. Pneumococcal 13-valent conjugate (PCV13) vaccine. You may need this if you have certain conditions and were not previously vaccinated. Pneumococcal polysaccharide (PPSV23) vaccine. You may need one or two doses if you smoke cigarettes or if you have certain conditions. Talk to your health care provider about which screenings and vaccines you need and how often you need them. This information is not intended to replace advice given to you by your health care provider. Make sure you discuss any questions you have with your health care provider. Document Released: 11/12/2015 Document Revised: 07/05/2016 Document Reviewed: 08/17/2015 Elsevier Interactive Patient Education  2017 Elsevier Inc.    Fall Prevention in the Home Falls can cause injuries. They can happen to people of all ages. There are many things you can do to make your home safe and to help prevent falls. What can I do on the outside of my home? Regularly fix the edges of walkways and driveways and fix any cracks. Remove anything that might make you trip as you walk through a door, such as a raised step or threshold. Trim any bushes or trees on the path to your home. Use bright outdoor lighting. Clear any walking paths of anything that might make someone trip, such as rocks or tools. Regularly check to see if handrails are loose or broken. Make sure that both sides of any steps have  handrails. Any raised decks and porches should have guardrails on the edges. Have any leaves, snow, or ice cleared regularly. Use sand or salt on walking paths during winter. Clean up any spills in your garage right away. This includes oil or grease spills. What can I do in the bathroom? Use night lights. Install grab bars by the toilet and in the tub and shower. Do not use towel bars as grab bars. Use non-skid mats or decals in the tub or shower. If you need to sit down in the shower, use a plastic, non-slip stool. Keep the floor dry. Clean up any water that spills on the floor as soon as it happens. Remove soap buildup in the tub or shower regularly. Attach bath mats securely with double-sided non-slip rug tape. Do not have throw rugs and other things on the floor that can make you trip. What can I do in the bedroom? Use night lights. Make sure that you have a light by your bed that is easy to reach. Do not use any sheets or blankets that are too big for your   bed. They should not hang down onto the floor. Have a firm chair that has side arms. You can use this for support while you get dressed. Do not have throw rugs and other things on the floor that can make you trip. What can I do in the kitchen? Clean up any spills right away. Avoid walking on wet floors. Keep items that you use a lot in easy-to-reach places. If you need to reach something above you, use a strong step stool that has a grab bar. Keep electrical cords out of the way. Do not use floor polish or wax that makes floors slippery. If you must use wax, use non-skid floor wax. Do not have throw rugs and other things on the floor that can make you trip. What can I do with my stairs? Do not leave any items on the stairs. Make sure that there are handrails on both sides of the stairs and use them. Fix handrails that are broken or loose. Make sure that handrails are as long as the stairways. Check any carpeting to make sure  that it is firmly attached to the stairs. Fix any carpet that is loose or worn. Avoid having throw rugs at the top or bottom of the stairs. If you do have throw rugs, attach them to the floor with carpet tape. Make sure that you have a light switch at the top of the stairs and the bottom of the stairs. If you do not have them, ask someone to add them for you. What else can I do to help prevent falls? Wear shoes that: Do not have high heels. Have rubber bottoms. Are comfortable and fit you well. Are closed at the toe. Do not wear sandals. If you use a stepladder: Make sure that it is fully opened. Do not climb a closed stepladder. Make sure that both sides of the stepladder are locked into place. Ask someone to hold it for you, if possible. Clearly mark and make sure that you can see: Any grab bars or handrails. First and last steps. Where the edge of each step is. Use tools that help you move around (mobility aids) if they are needed. These include: Canes. Walkers. Scooters. Crutches. Turn on the lights when you go into a dark area. Replace any light bulbs as soon as they burn out. Set up your furniture so you have a clear path. Avoid moving your furniture around. If any of your floors are uneven, fix them. If there are any pets around you, be aware of where they are. Review your medicines with your doctor. Some medicines can make you feel dizzy. This can increase your chance of falling. Ask your doctor what other things that you can do to help prevent falls. This information is not intended to replace advice given to you by your health care provider. Make sure you discuss any questions you have with your health care provider. Document Released: 08/12/2009 Document Revised: 03/23/2016 Document Reviewed: 11/20/2014 Elsevier Interactive Patient Education  2017 [employer].

## 2023-05-15 ENCOUNTER — Encounter: Payer: 59 | Admitting: Internal Medicine

## 2023-05-15 NOTE — Progress Notes (Deleted)
Subjective:    Patient ID: Sarah Mcdonald, female    DOB: 1956/06/30, 67 y.o.   MRN: [id number]  HPI  Patient presents to clinic today for her annual exam.  Flu: 07/2018 Tetanus: 03/2010  COVID: Never Shingrix: Never Pap smear: Hysterectomy Mammogram: Bone density: Colon screening: Vision screening: Dentist:  Diet: Exercise:   Review of Systems     Past Medical History:  Diagnosis Date   ADHD    Allergy    Anxiety    Back pain    COPD (chronic obstructive pulmonary disease) (HCC)    Depression    Eczema    Fibromyalgia    GERD (gastroesophageal reflux disease)    History of hepatitis C     Current Outpatient Medications  Medication Sig Dispense Refill   metroNIDAZOLE (METROGEL) 0.75 % vaginal gel Place 1 Applicatorful vaginally 2 (two) times daily. (Patient not taking: Reported on 04/19/2023) 70 g 0   albuterol (VENTOLIN HFA) 108 (90 Base) MCG/ACT inhaler Inhale 2 puffs into the lungs every 6 (six) hours as needed for wheezing or shortness of breath. 8 g 2   lidocaine (LIDODERM) 5 % Place 1 patch onto the skin daily. Remove & Discard patch within 12 hours or as directed by MD 30 patch 2   omeprazole (PRILOSEC) 20 MG capsule Take 1 capsule (20 mg total) by mouth daily. 90 capsule 0   triamcinolone (KENALOG) 0.025 % ointment Apply 1 Application topically 2 (two) times daily. 30 g 0   No current facility-administered medications for this visit.    Allergies  Allergen Reactions   Seroquel [Quetiapine Fumarate] Other (See Comments)    Altered mental status   Amitriptyline     Family History  Problem Relation Age of Onset   Heart failure Mother    Emphysema Mother    Obesity Mother    Cancer Sister        Unknow (Maybe breast cancer)   Throat cancer Sister    Pneumonia Sister    Cancer Brother    COPD Brother    Hepatitis C Brother     Social History   Socioeconomic History   Marital status: Divorced    Spouse name: Not on file   Number of  children: 3   Years of education: some college   Highest education level: Some college, no degree  Occupational History   Occupation: Disabled  Tobacco Use   Smoking status: Former    Current packs/day: 0.00    Average packs/day: 1 pack/day for 41.0 years (41.0 ttl pk-yrs)    Types: Cigarettes    Start date: 09/14/1973    Quit date: 09/14/2014    Years since quitting: 8.6   Smokeless tobacco: Never   Tobacco comments:    smoking cessation materials not required  Vaping Use   Vaping status: Former   Start date: 09/19/2014   Quit date: 05/19/2017  Substance and Sexual Activity   Alcohol use: Yes    Alcohol/week: 6.0 standard drinks of alcohol    Types: 6 Cans of beer per week    Comment: 6 pack per week   Drug use: No   Sexual activity: Yes  Other Topics Concern   Not on file  Social History Narrative   Lives with long time partner whom she calls her husband   Social Determinants of Health   Financial Resource Strain: Low Risk  (04/19/2023)   Overall Financial Resource Strain (CARDIA)    Difficulty of Paying Living Expenses: Not  hard at all  Recent Concern: Financial Resource Strain - Medium Risk (02/06/2023)   Overall Financial Resource Strain (CARDIA)    Difficulty of Paying Living Expenses: Somewhat hard  Food Insecurity: No Food Insecurity (04/19/2023)   Hunger Vital Sign    Worried About Running Out of Food in the Last Year: Never true    Ran Out of Food in the Last Year: Never true  Recent Concern: Food Insecurity - Food Insecurity Present (02/06/2023)   Hunger Vital Sign    Worried About Running Out of Food in the Last Year: Sometimes true    Ran Out of Food in the Last Year: Sometimes true  Transportation Needs: No Transportation Needs (04/19/2023)   PRAPARE - Administrator, Civil Service (Medical): No    Lack of Transportation (Non-Medical): No  Recent Concern: Transportation Needs - Unmet Transportation Needs (02/06/2023)   PRAPARE - Transportation     Lack of Transportation (Medical): Yes    Lack of Transportation (Non-Medical): Yes  Physical Activity: Insufficiently Active (04/19/2023)   Exercise Vital Sign    Days of Exercise per Week: 3 days    Minutes of Exercise per Session: 30 min  Stress: Stress Concern Present (04/19/2023)   [employer] of Occupational Health - Occupational Stress Questionnaire    Feeling of Stress : To some extent  Social Connections: Patient Declined (04/19/2023)   Social Connection and Isolation Panel [NHANES]    Frequency of Communication with Friends and Family: Patient declined    Frequency of Social Gatherings with Friends and Family: Patient declined    Attends Religious Services: Patient declined    Database administrator or Organizations: Patient declined    Attends Banker Meetings: Patient declined    Marital Status: Patient declined  Recent Concern: Social Connections - Moderately Isolated (02/06/2023)   Social Connection and Isolation Panel [NHANES]    Frequency of Communication with Friends and Family: More than three times a week    Frequency of Social Gatherings with Friends and Family: Never    Attends Religious Services: 1 to 4 times per year    Active Member of [employer] or Organizations: No    Attends Engineer, structural: Not on file    Marital Status: Divorced  Intimate Partner Violence: Not At Risk (04/19/2023)   Humiliation, Afraid, Rape, and Kick questionnaire    Fear of Current or Ex-Partner: No    Emotionally Abused: No    Physically Abused: No    Sexually Abused: No     Constitutional: Denies fever, malaise, fatigue, headache or abrupt weight changes.  HEENT: Denies eye pain, eye redness, ear pain, ringing in the ears, wax buildup, runny nose, nasal congestion, bloody nose, or sore throat. Respiratory: Denies difficulty breathing, shortness of breath, cough or sputum production.   Cardiovascular: Denies chest pain, chest tightness, palpitations or swelling  in the hands or feet.  Gastrointestinal: Denies abdominal pain, bloating, constipation, diarrhea or blood in the stool.  GU: Denies urgency, frequency, pain with urination, burning sensation, blood in urine, odor or discharge. Musculoskeletal: Patient reports chronic joint and muscle pain.  Denies decrease in range of motion, difficulty with gait, or joint swelling.  Skin: Denies redness, rashes, lesions or ulcercations.  Neurological: Patient reports inattention.  Denies dizziness, difficulty with memory, difficulty with speech or problems with balance and coordination.  Psych: Patient has a history of anxiety and depression.  Denies SI/HI.  No other specific complaints in a complete review of  systems (except as listed in HPI above).  Objective:   Physical Exam   There were no vitals taken for this visit. Wt Readings from Last 3 Encounters:  04/19/23 130 lb (59 kg)  02/06/23 127 lb (57.6 kg)  12/12/22 124 lb (56.2 kg)    General: Appears their stated age, well developed, well nourished in NAD. Skin: Warm, dry and intact. No rashes, lesions or ulcerations noted. HEENT: Head: normal shape and size; Eyes: sclera white, no icterus, conjunctiva pink, PERRLA and EOMs intact; Ears: Tm's gray and intact, normal light reflex; Nose: mucosa pink and moist, septum midline; Throat/Mouth: Teeth present, mucosa pink and moist, no exudate, lesions or ulcerations noted.  Neck:  Neck supple, trachea midline. No masses, lumps or thyromegaly present.  Cardiovascular: Normal rate and rhythm. S1,S2 noted.  No murmur, rubs or gallops noted. No JVD or BLE edema. No carotid bruits noted. Pulmonary/Chest: Normal effort and positive vesicular breath sounds. No respiratory distress. No wheezes, rales or ronchi noted.  Abdomen: Soft and nontender. Normal bowel sounds. No distention or masses noted. Liver, spleen and kidneys non palpable. Musculoskeletal: Normal range of motion. No signs of joint swelling. No  difficulty with gait.  Neurological: Alert and oriented. Cranial nerves II-XII grossly intact. Coordination normal.  Psychiatric: Mood and affect normal. Behavior is normal. Judgment and thought content normal.    BMET    Component Value Date/Time   NA 141 11/27/2022 1103   K 4.7 11/27/2022 1103   CL 107 11/27/2022 1103   CO2 24 11/27/2022 1103   GLUCOSE 88 11/27/2022 1103   BUN 16 11/27/2022 1103   CREATININE 0.78 11/27/2022 1103   CALCIUM 9.2 11/27/2022 1103    Lipid Panel     Component Value Date/Time   CHOL 252 (H) 11/27/2022 1103   TRIG 87 11/27/2022 1103   HDL 95 11/27/2022 1103   CHOLHDL 2.7 11/27/2022 1103   LDLCALC 138 (H) 11/27/2022 1103    CBC    Component Value Date/Time   WBC 5.9 11/27/2022 1103   RBC 5.03 11/27/2022 1103   HGB 13.9 11/27/2022 1103   HCT 42.0 11/27/2022 1103   PLT 313 11/27/2022 1103   MCV 83.5 11/27/2022 1103   MCH 27.6 11/27/2022 1103   MCHC 33.1 11/27/2022 1103   RDW 12.9 11/27/2022 1103    Hgb A1C Lab Results  Component Value Date   HGBA1C 6.0 (H) 11/27/2022           Assessment & Plan:   Preventative health maintenance:  Encouraged her to get a flu shot fall She declines tetanus for financial reasons, advised if she gets bit or cut to go get this done Encouraged her to get her COVID-vaccine Discussed Shingrix vaccine, she will check coverage with her insurance company and schedule visit if she would like to have this done She no longer needs to screen for cervical cancer Mammogram and bone density ordered-she will call to schedule Referral to GI for screening colonoscopy Encouraged her to consume a balanced diet and exercise regimen Advised her to see an eye doctor and dentist annually We will check CBC, c-Met, lipid, A1c today  RTC in 6 months, follow-up chronic conditions Nicki Reaper, NP

## 2023-05-26 ENCOUNTER — Other Ambulatory Visit: Payer: Self-pay | Admitting: Internal Medicine

## 2023-05-28 NOTE — Telephone Encounter (Signed)
Requested Prescriptions  Pending Prescriptions Disp Refills   albuterol (VENTOLIN HFA) 108 (90 Base) MCG/ACT inhaler [Pharmacy Med Name: ALBUTEROL HFA INH (200 PUFFS) 6.7GM] 6.7 g 1    Sig: INHALE 2 PUFFS INTO THE LUNGS EVERY 6 HOURS AS NEEDED FOR WHEEZING OR SHORTNESS OF BREATH     Pulmonology:  Beta Agonists 2 Passed - 05/26/2023  9:18 AM      Passed - Last BP in normal range    BP Readings from Last 1 Encounters:  02/06/23 118/68         Passed - Last Heart Rate in normal range    Pulse Readings from Last 1 Encounters:  02/06/23 66         Passed - Valid encounter within last 12 months    Recent Outpatient Visits           3 months ago Vaginal irritation   [place] [facility] [place], [place] W, NP   5 months ago Chronic right shoulder pain   [place] [facility] [place], Salvadore Oxford, NP   6 months ago History of hepatitis C   [place] [facility] [place], [place], NP   3 years ago Gastroesophageal reflux disease, unspecified whether esophagitis present   [facility], MD   4 years ago Flexural eczema   [place] [facility], MD

## 2023-06-04 ENCOUNTER — Other Ambulatory Visit: Payer: Self-pay | Admitting: Internal Medicine

## 2023-06-05 NOTE — Telephone Encounter (Signed)
Requested Prescriptions  Pending Prescriptions Disp Refills   omeprazole (PRILOSEC) 20 MG capsule [Pharmacy Med Name: OMEPRAZOLE 20MG  CAPSULES] 90 capsule 0    Sig: TAKE 1 CAPSULE(20 MG) BY MOUTH DAILY     Gastroenterology: Proton Pump Inhibitors Passed - 06/04/2023 10:05 AM      Passed - Valid encounter within last 12 months    Recent Outpatient Visits           3 months ago Vaginal irritation   [place] [facility] [place], [place] W, NP   5 months ago Chronic right shoulder pain   [place] [facility] [place], Salvadore Oxford, NP   6 months ago History of hepatitis C   [place] [facility] [place], Salvadore Oxford, NP   3 years ago Gastroesophageal reflux disease, unspecified whether esophagitis present   [facility], MD   4 years ago Flexural eczema   [place] [facility], MD

## 2023-07-31 DEATH — deceased
